# Patient Record
Sex: Female | Born: 1950 | Race: Black or African American | Hispanic: No | Marital: Married
Health system: Southern US, Community
[De-identification: ages and names within clinical notes are randomized; demographics above are authoritative.]

---

## 2016-01-06 ENCOUNTER — Inpatient Hospital Stay (HOSPITAL_COMMUNITY): Payer: Medicare Other

## 2016-01-06 ENCOUNTER — Inpatient Hospital Stay (HOSPITAL_COMMUNITY)
Admission: AD | Admit: 2016-01-06 | Discharge: 2016-01-10 | DRG: 208 | Disposition: A | Discharge status: Home / Self Care | Payer: Medicare Other | Source: Other Acute Inpatient Hospital | Attending: Family Medicine | Admitting: Family Medicine

## 2016-01-06 DIAGNOSIS — Z681 Body mass index (BMI) 19 or less, adult: Secondary | ICD-10-CM

## 2016-01-06 DIAGNOSIS — A047 Enterocolitis due to Clostridium difficile: Secondary | ICD-10-CM | POA: Diagnosis not present

## 2016-01-06 DIAGNOSIS — J9602 Acute respiratory failure with hypercapnia: Secondary | ICD-10-CM | POA: Diagnosis present

## 2016-01-06 DIAGNOSIS — D638 Anemia in other chronic diseases classified elsewhere: Secondary | ICD-10-CM | POA: Diagnosis present

## 2016-01-06 DIAGNOSIS — A0472 Enterocolitis due to Clostridium difficile, not specified as recurrent: Secondary | ICD-10-CM | POA: Clinically undetermined

## 2016-01-06 DIAGNOSIS — N2581 Secondary hyperparathyroidism of renal origin: Secondary | ICD-10-CM | POA: Diagnosis present

## 2016-01-06 DIAGNOSIS — E43 Unspecified severe protein-calorie malnutrition: Secondary | ICD-10-CM | POA: Diagnosis present

## 2016-01-06 DIAGNOSIS — N186 End stage renal disease: Secondary | ICD-10-CM | POA: Diagnosis present

## 2016-01-06 DIAGNOSIS — J9601 Acute respiratory failure with hypoxia: Secondary | ICD-10-CM | POA: Diagnosis present

## 2016-01-06 DIAGNOSIS — R0600 Dyspnea, unspecified: Secondary | ICD-10-CM | POA: Diagnosis present

## 2016-01-06 DIAGNOSIS — Z992 Dependence on renal dialysis: Secondary | ICD-10-CM | POA: Diagnosis not present

## 2016-01-06 DIAGNOSIS — I161 Hypertensive emergency: Secondary | ICD-10-CM | POA: Diagnosis present

## 2016-01-06 DIAGNOSIS — J810 Acute pulmonary edema: Secondary | ICD-10-CM | POA: Diagnosis present

## 2016-01-06 DIAGNOSIS — D649 Anemia, unspecified: Secondary | ICD-10-CM | POA: Diagnosis present

## 2016-01-06 DIAGNOSIS — I120 Hypertensive chronic kidney disease with stage 5 chronic kidney disease or end stage renal disease: Secondary | ICD-10-CM | POA: Diagnosis present

## 2016-01-06 DIAGNOSIS — E8770 Fluid overload, unspecified: Secondary | ICD-10-CM | POA: Diagnosis present

## 2016-01-06 LAB — CBC WITH DIFFERENTIAL/PLATELET
BASOS PCT: 0 %
Basophils Absolute: 0 10*3/uL (ref 0.0–0.1)
EOS ABS: 0.1 10*3/uL (ref 0.0–0.7)
Eosinophils Relative: 1 %
HEMATOCRIT: 30 % — AB (ref 36.0–46.0)
HEMOGLOBIN: 9.6 g/dL — AB (ref 12.0–15.0)
LYMPHS PCT: 9 %
Lymphs Abs: 0.6 10*3/uL — ABNORMAL LOW (ref 0.7–4.0)
MCH: 32.1 pg (ref 26.0–34.0)
MCHC: 32 g/dL (ref 30.0–36.0)
MCV: 100.3 fL — ABNORMAL HIGH (ref 78.0–100.0)
MONOS PCT: 4 %
Monocytes Absolute: 0.2 10*3/uL (ref 0.1–1.0)
NEUTROS ABS: 5.3 10*3/uL (ref 1.7–7.7)
Neutrophils Relative %: 86 %
Platelets: 175 10*3/uL (ref 150–400)
RBC: 2.99 MIL/uL — ABNORMAL LOW (ref 3.87–5.11)
RDW: 15.7 % — ABNORMAL HIGH (ref 11.5–15.5)
WBC: 6.2 10*3/uL (ref 4.0–10.5)

## 2016-01-06 LAB — LACTIC ACID, PLASMA: Lactic Acid, Venous: 0.9 mmol/L (ref 0.5–1.9)

## 2016-01-06 LAB — BLOOD GAS, ARTERIAL
Acid-base deficit: 0.8 mmol/L (ref 0.0–2.0)
BICARBONATE: 23.9 meq/L (ref 20.0–24.0)
Drawn by: 252031
FIO2: 100
MECHVT: 450 mL
O2 Saturation: 99.2 %
PATIENT TEMPERATURE: 98.6
PO2 ART: 233 mmHg — AB (ref 80.0–100.0)
RATE: 25 resp/min
TCO2: 25.2 mmol/L (ref 0–100)
pCO2 arterial: 42.9 mmHg (ref 35.0–45.0)
pH, Arterial: 7.364 (ref 7.350–7.450)

## 2016-01-06 LAB — GLUCOSE, CAPILLARY
GLUCOSE-CAPILLARY: 74 mg/dL (ref 65–99)
GLUCOSE-CAPILLARY: 82 mg/dL (ref 65–99)
Glucose-Capillary: 87 mg/dL (ref 65–99)
Glucose-Capillary: 92 mg/dL (ref 65–99)
Glucose-Capillary: 95 mg/dL (ref 65–99)

## 2016-01-06 LAB — COMPREHENSIVE METABOLIC PANEL
ALK PHOS: 78 U/L (ref 38–126)
ALT: 109 U/L — AB (ref 14–54)
AST: 95 U/L — AB (ref 15–41)
Albumin: 2.8 g/dL — ABNORMAL LOW (ref 3.5–5.0)
Anion gap: 13 (ref 5–15)
BUN: 68 mg/dL — AB (ref 6–20)
CHLORIDE: 105 mmol/L (ref 101–111)
CO2: 24 mmol/L (ref 22–32)
CREATININE: 9.99 mg/dL — AB (ref 0.44–1.00)
Calcium: 9.5 mg/dL (ref 8.9–10.3)
GFR calc Af Amer: 4 mL/min — ABNORMAL LOW (ref >60)
GFR, EST NON AFRICAN AMERICAN: 4 mL/min — AB (ref >60)
Glucose, Bld: 89 mg/dL (ref 65–99)
Potassium: 4.3 mmol/L (ref 3.5–5.1)
Sodium: 142 mmol/L (ref 135–145)
Total Bilirubin: 0.7 mg/dL (ref 0.3–1.2)
Total Protein: 5.5 g/dL — ABNORMAL LOW (ref 6.5–8.1)

## 2016-01-06 LAB — C DIFFICILE QUICK SCREEN W PCR REFLEX
C DIFFICILE (CDIFF) TOXIN: POSITIVE — AB
C Diff antigen: POSITIVE — AB
C Diff interpretation: DETECTED

## 2016-01-06 LAB — MRSA PCR SCREENING: MRSA by PCR: NEGATIVE

## 2016-01-06 LAB — TRIGLYCERIDES: Triglycerides: 109 mg/dL (ref <150)

## 2016-01-06 LAB — TROPONIN I
TROPONIN I: 0.29 ng/mL — AB (ref <0.03)
Troponin I: 0.2 ng/mL (ref <0.03)

## 2016-01-06 MED ORDER — VANCOMYCIN HCL 500 MG IV SOLR
500.0000 mg | INTRAVENOUS | Status: DC | PRN
  Filled 2016-01-06: qty 500

## 2016-01-06 MED ORDER — PIPERACILLIN-TAZOBACTAM 3.375 G IVPB
3.3750 g | Freq: Two times a day (BID) | INTRAVENOUS | Status: DC
  Administered 2016-01-06 (×2): 3.375 g via INTRAVENOUS
  Filled 2016-01-06 (×5): qty 50

## 2016-01-06 MED ORDER — FENTANYL CITRATE (PF) 100 MCG/2ML IJ SOLN
50.0000 ug | INTRAMUSCULAR | Status: DC | PRN
  Filled 2016-01-06: qty 2

## 2016-01-06 MED ORDER — PANTOPRAZOLE SODIUM 40 MG PO PACK
40.0000 mg | PACK | Freq: Every day | ORAL | Status: DC
Start: 2016-01-06 — End: 2016-01-07
  Administered 2016-01-06: 40 mg
  Filled 2016-01-06 (×2): qty 20

## 2016-01-06 MED ORDER — FENTANYL CITRATE (PF) 100 MCG/2ML IJ SOLN
50.0000 ug | INTRAMUSCULAR | Status: DC | PRN
  Administered 2016-01-06: 50 ug via INTRAVENOUS
  Filled 2016-01-06: qty 2

## 2016-01-06 MED ORDER — HYDRALAZINE HCL 20 MG/ML IJ SOLN: 5.0000 mg | INTRAMUSCULAR | Status: DC | PRN

## 2016-01-06 MED ORDER — VANCOMYCIN 50 MG/ML ORAL SOLUTION
125.0000 mg | Freq: Four times a day (QID) | ORAL | Status: DC
  Administered 2016-01-06 (×2): 125 mg via ORAL
  Filled 2016-01-06 (×6): qty 2.5

## 2016-01-06 MED ORDER — PROPOFOL 1000 MG/100ML IV EMUL
0.0000 ug/kg/min | INTRAVENOUS | Status: DC
  Administered 2016-01-06 – 2016-01-07 (×6): 50 ug/kg/min via INTRAVENOUS
  Filled 2016-01-06 (×6): qty 100

## 2016-01-06 MED ORDER — SODIUM CHLORIDE 0.9 % IV SOLN: 250.0000 mL | INTRAVENOUS | Status: DC | PRN

## 2016-01-06 MED ORDER — VANCOMYCIN HCL 500 MG IV SOLR
500.0000 mg | INTRAVENOUS | Status: DC
  Administered 2016-01-06: 500 mg via INTRAVENOUS
  Filled 2016-01-06: qty 500

## 2016-01-06 MED ORDER — HEPARIN SODIUM (PORCINE) 5000 UNIT/ML IJ SOLN
5000.0000 [IU] | Freq: Three times a day (TID) | INTRAMUSCULAR | Status: DC
  Administered 2016-01-06 – 2016-01-10 (×12): 5000 [IU] via SUBCUTANEOUS
  Filled 2016-01-06 (×14): qty 1

## 2016-01-06 MED ORDER — ANTISEPTIC ORAL RINSE SOLUTION (CORINZ)
7.0000 mL | Freq: Four times a day (QID) | OROMUCOSAL | Status: DC
  Administered 2016-01-06 – 2016-01-09 (×12): 7 mL via OROMUCOSAL

## 2016-01-06 MED ORDER — PIPERACILLIN-TAZOBACTAM 3.375 G IVPB
3.3750 g | Freq: Two times a day (BID) | INTRAVENOUS | Status: DC
  Filled 2016-01-06 (×2): qty 50

## 2016-01-06 MED ORDER — ONDANSETRON HCL 4 MG/2ML IJ SOLN
4.0000 mg | Freq: Four times a day (QID) | INTRAMUSCULAR | Status: DC | PRN
  Administered 2016-01-09: 4 mg via INTRAVENOUS
  Filled 2016-01-06: qty 2

## 2016-01-06 MED ORDER — CHLORHEXIDINE GLUCONATE 0.12% ORAL RINSE (MEDLINE KIT)
15.0000 mL | Freq: Two times a day (BID) | OROMUCOSAL | Status: DC
  Administered 2016-01-06 – 2016-01-09 (×6): 15 mL via OROMUCOSAL

## 2016-01-06 MED ORDER — PROPOFOL 1000 MG/100ML IV EMUL
0.0000 ug/kg/min | INTRAVENOUS | Status: DC
  Administered 2016-01-06: 50 ug/kg/min via INTRAVENOUS

## 2016-01-06 MED ORDER — ACETAMINOPHEN 325 MG PO TABS
650.0000 mg | ORAL_TABLET | ORAL | Status: DC | PRN
  Administered 2016-01-08 (×2): 650 mg via ORAL
  Filled 2016-01-06 (×2): qty 2

## 2016-01-06 MED ORDER — METRONIDAZOLE IN NACL 5-0.79 MG/ML-% IV SOLN
500.0000 mg | Freq: Three times a day (TID) | INTRAVENOUS | Status: DC
  Administered 2016-01-06 – 2016-01-07 (×3): 500 mg via INTRAVENOUS
  Filled 2016-01-06 (×5): qty 100

## 2016-01-06 MED ORDER — INSULIN ASPART 100 UNIT/ML ~~LOC~~ SOLN: 0.0000 [IU] | SUBCUTANEOUS | Status: DC

## 2016-01-06 NOTE — Procedures (Signed)
Pt seen on HD.  Ap 150 Vp 210, BFR 400.  SBP 95

## 2016-01-06 NOTE — Consult Note (Signed)
Cristina Johnson is an 65 y.o. female referred by Dr Cristina Johnson   Chief Complaint: ESRD HPI: [name] with ESRD on HD in [place] MWF and presented to [facility] with acute SOB.  Found to be in pulm edema with extremely high BP 257/127 per H&P.  She was intubated and transferred to [facility].  Labs from [facility]:  Hg 10.5, WBC 17.4, Scr 9.89, K 4.4, CO2 23.  CXR shows mild Pulm edema  No past medical history on file.  No past surgical history on file.  No family history on file. Social History:  has no tobacco, alcohol, and drug history on file.  Allergies: Allergies not on file  No prescriptions prior to admission.     Lab Results: UA: ND No results for input(s): WBC, HGB, HCT, PLT in the last 72 hours. BMET No results for input(s): NA, K, CL, CO2, GLUCOSE, BUN, CREATININE, CALCIUM, PHOS in the last 72 hours.  Invalid input(s): MAG LFT No results for input(s): PROT, ALBUMIN, AST, ALT, ALKPHOS, BILITOT, BILIDIR, IBILI in the last 72 hours. Dg Chest Port 1 View  Result Date: 01/06/2016 CLINICAL DATA:  Endotracheal tube and central line placement. EXAM: PORTABLE CHEST 1 VIEW COMPARISON:  None. FINDINGS: The endotracheal tube is 1.5 cm from the carina. Enteric tube in place, tip and side port below the diaphragm. Catheter tubing projects in the left supraclavicular soft tissues tip is directed toward the axilla. (Discussion with patient's nurse patient has an external jugular line) No other central line is seen. Mild cardiomegaly. There is atherosclerosis of the thoracic aorta. Fine interstitial opacities concerning for pulmonary edema. More confluent retrocardiac opacity may be atelectasis or pleural fluid. Minimal fluid in the right minor fissure. No evidence of pneumothorax. No acute osseous abnormality is seen. IMPRESSION: 1. Endotracheal tube 1.5 cm from the carina. This is borderline low and retraction of 1-2 cm recommended. 2. Catheter projecting in the left supraclavicular soft  tissues with tip directed toward the axilla, recommend removal and repositioning. 3. Interstitial opacities suspicious for pulmonary edema. Cardiomegaly with atherosclerosis of the thoracic aorta. These results were called by telephone at the time of interpretation on 01/06/2016 at 7:07 am to the nurse caring for the patient who verbally acknowledged these results. Electronically Signed   By: Rubye OaksMelanie  Ehinger M.D.   On: 01/06/2016 07:09    ROS: Pt intubated  PHYSICAL EXAM: Blood pressure (!) 143/64, pulse 82, temperature 98.5 F (36.9 C), temperature source Oral, resp. rate (!) 25, height 5\' 5"  (1.651 m), weight 54.5 kg (120 lb 2.4 oz), SpO2 98 %. HEENT: PERRLA, intubated NECK:Mild JVD LUNGS:Basilar crackles CARDIAC: RRR wo MRG ABD:+ BS Soft, nondistended, No HSM EXT: No edema. LUA AVF + bruit NEURO:Sedated  Assessment: 1. SOB, Pulm edema 2. HTN, improved with sedation 3. ESRD PLAN: 1. Plan HD today 2. Will get info from her HD unit   Cristina Johnson 01/06/2016, 10:43 AM

## 2016-01-06 NOTE — Progress Notes (Signed)
Pharmacy Antibiotic Note  Cristina BenderKatherine Johnson is a 65 y.o. female admitted on 01/06/2016 with sepsis.  Pharmacy has been consulted for Vanc/Zosyn  Dosing.   Patient has ESRD on dialysis on MWF. It is unknown if she missed any sessions. [facility] was a [facility] [place] and confirmed with [facility] that no blood cultures were drawn there but she received Vanc 1 gm at 0430 and Zosyn 3.375 gm at 0319.   WBC- 17.43 at [place], Afebrile   Plan: Zosyn 3.375 IV Q 12 H (4 hour infusion) Vanc 500 mg IV with dialysis  [facility] Nurse was instructed not to give Vanc unless patient goes to HD.  F/U nephrology recomendations  F/U cultures, clinical s/sx of improvement  Height: 5\' 5"  (165.1 cm) Weight: 120 lb 2.4 oz (54.5 kg) IBW/kg (Calculated) : 57  Temp (24hrs), Avg:98.5 F (36.9 C), Min:98.5 F (36.9 C), Max:98.5 F (36.9 C)  No results for input(s): WBC, CREATININE, LATICACIDVEN, VANCOTROUGH, VANCOPEAK, VANCORANDOM, GENTTROUGH, GENTPEAK, GENTRANDOM, TOBRATROUGH, TOBRAPEAK, TOBRARND, AMIKACINPEAK, AMIKACINTROU, AMIKACIN in the last 168 hours.  CrCl cannot be calculated (No order found.).    Allergies not on file  Antimicrobials this admission: Vanc  8/21 >>  Zosyn  8/21 >>     Microbiology results: 8/21 BCx: Pending  8/21 RCx: Pending    Thank you for allowing pharmacy to be a part of this patient's care.  Delila SpenceEmily A Stewart, Pharm D Pharmacy Resident  Pager [phone]  01/06/2016 8:56 AM

## 2016-01-06 NOTE — Progress Notes (Signed)
01/06/2016 11:47 AM Hemodialysis Note; This [place] [phone].  [name].

## 2016-01-06 NOTE — Progress Notes (Signed)
Critical troponin 0.29 reported to Dr Isaiah SergeMannam, also made aware of ST changes and inability to collect urine culture due to anuria.

## 2016-01-06 NOTE — H&P (Addendum)
PULMONARY / CRITICAL CARE MEDICINE   Name: Cristina Johnson MRN: [id number] DOB: 13-Dec-1950    ADMISSION DATE:  01/06/2016 CONSULTATION DATE:  01/06/16  REFERRING MD:  [facility]  CHIEF COMPLAINT:  Acute respiratory failure  HISTORY OF PRESENT ILLNESS:   Cristina Johnson is a 65 year old with past medical history of end-stage renal disease on hemodialysis (M-W-F via Lt arm AVF). She [facility] with acute onset dyspnea. BP on presentation was 257/127.  History prior to presentation is unclear and we didn't know if she missed any HD sessions. She had a chest x-ray that showed acute pulmonary edema. She was given Vanco, Zosyn, ,intubated for respiratory failure and transferred to [facility] for further management.  PAST MEDICAL HISTORY :  She  has no past medical history on file.  PAST SURGICAL HISTORY: She  has no past surgical history on file.  Allergies not on file  No current facility-administered medications on file prior to encounter.    No current outpatient prescriptions on file prior to encounter.    FAMILY HISTORY:  Her has no family status information on file.    SOCIAL HISTORY: She    REVIEW OF SYSTEMS:   Unable to obtain as patient is sedated on vent  SUBJECTIVE:   VITAL SIGNS: Temp 98.5 F (36.9 C) (Oral)   Ht 5\' 5"  (1.651 m)   HEMODYNAMICS:    VENTILATOR SETTINGS: Vent Mode: PRVC FiO2 (%):  [100 %] 100 % Set Rate:  [25 bmp] 25 bmp Vt Set:  [450 mL] 450 mL PEEP:  [5 cmH20] 5 cmH20 Plateau Pressure:  [15 cmH20] 15 cmH20  INTAKE / OUTPUT: No intake/output data recorded.  PHYSICAL EXAMINATION: General:  Comfortable, no distress Neuro:  No focal deficits HEENT: Moist mucus membranes, No thyromegaly, JVD Cardiovascular:  RRR, no MRG Lungs:  Clear, No wheeze or crackles Abdomen:  Soft, +_ BS Musculoskeletal:  Normal tone and bulk Skin:  Intact  LABS:  BMET No results for input(s): NA, K, CL, CO2, BUN, CREATININE, GLUCOSE in the  last 168 hours.  Electrolytes No results for input(s): CALCIUM, MG, PHOS in the last 168 hours.  CBC No results for input(s): WBC, HGB, HCT, PLT in the last 168 hours.  Coag's No results for input(s): APTT, INR in the last 168 hours.  Sepsis Markers No results for input(s): LATICACIDVEN, PROCALCITON, O2SATVEN in the last 168 hours.  ABG No results for input(s): PHART, PCO2ART, PO2ART in the last 168 hours.  Liver Enzymes No results for input(s): AST, ALT, ALKPHOS, BILITOT, ALBUMIN in the last 168 hours.  Cardiac Enzymes No results for input(s): TROPONINI, PROBNP in the last 168 hours.  Glucose  Recent Labs Lab 01/06/16 0617  GLUCAP 74    Imaging No results found.   STUDIES:  Labs from [place] significant for 01/06/16 BUN/Cr- 48/9.9 CXR - Pulmonary edema, Images reviewed CT head- Normal per report. Unable to review images. EKG- Sinus tachy, no acute ST-T changes. BNP - 4438, troponin 0.017 7.11/54/223/97%, repeat 7.25/44/87/92% WBC 17.4 Lactic acid 6, repeat 4 Mild elevation in LFTs  CULTURES: Bcx X 2 >  ANTIBIOTICS: Vanco 8/21 > Zosyn 8/21 >  SIGNIFICANT EVENTS:  LINES/TUBES: ETT 8/21  DISCUSSION: 65 year old with end-stage renal dialysis and HD 3 times a week. Presents with acute respiratory failure, pulmonary edema, hypertensive emergency. Intubated for respiratory protection.  She likely need to be dialyzed today. Assess for extubation postdialysis and cover with antibiotics empirically for infection.  ASSESSMENT / PLAN:  PULMONARY A:  Acute hypoxic, hypercarbic resp failure P:   Check ABG, CXR Continue vent support Wake up and weaning assessments after dialysis.  CARDIOVASCULAR A:  Hypertensive emergency P:  Hydralazine PRN Follow troponins, BNP, lactic acid Echocardiogram  RENAL A:   ESRD on HD M-W-F Lt arm AVF P:   Consult nephrology  GASTROINTESTINAL A:   Stable LFTs elevation P:   Keep NPO Repeat LFTs Stress ulcer  prophylaxis  HEMATOLOGIC A:   Leukocytosis from sepsis vs stress P:  Monitor  INFECTIOUS A:   Concern for infection given elevated LA and WBC P:   Cover with zosyn, vanco Check procalcitonin, cultures.  ENDOCRINE A:   Stable P:   SSI coverage  NEUROLOGIC A:   Sedation while on vent P:   RASS goal: 0 Propofol gtt   FAMILY  - Updates: No family at bedside - Inter-disciplinary family meet or Palliative Care meeting due by:  01/13/16  Critical care time- 40 mins.  Chilton GreathousePraveen Gracieann Stannard MD [place] [facility] Pager [phone] If no answer or after 3pm call: [phone] 01/06/2016, 6:47 AM

## 2016-01-07 ENCOUNTER — Inpatient Hospital Stay (HOSPITAL_COMMUNITY): Payer: Medicare Other

## 2016-01-07 DIAGNOSIS — R0600 Dyspnea, unspecified: Secondary | ICD-10-CM

## 2016-01-07 LAB — GLUCOSE, CAPILLARY
GLUCOSE-CAPILLARY: 99 mg/dL (ref 65–99)
GLUCOSE-CAPILLARY: 119 mg/dL — AB (ref 65–99)
GLUCOSE-CAPILLARY: 104 mg/dL — AB (ref 65–99)
GLUCOSE-CAPILLARY: 113 mg/dL — AB (ref 65–99)
Glucose-Capillary: 116 mg/dL — ABNORMAL HIGH (ref 65–99)

## 2016-01-07 LAB — ECHOCARDIOGRAM COMPLETE
HEIGHTINCHES: 65 in
WEIGHTICAEL: 1806.01 [oz_av]

## 2016-01-07 LAB — BASIC METABOLIC PANEL
ANION GAP: 19 — AB (ref 5–15)
BUN: 30 mg/dL — AB (ref 6–20)
CO2: 23 mmol/L (ref 22–32)
Calcium: 9.7 mg/dL (ref 8.9–10.3)
Chloride: 97 mmol/L — ABNORMAL LOW (ref 101–111)
Creatinine, Ser: 5.46 mg/dL — ABNORMAL HIGH (ref 0.44–1.00)
GFR calc Af Amer: 9 mL/min — ABNORMAL LOW (ref >60)
GFR, EST NON AFRICAN AMERICAN: 7 mL/min — AB (ref >60)
GLUCOSE: 90 mg/dL (ref 65–99)
POTASSIUM: 3.7 mmol/L (ref 3.5–5.1)
Sodium: 139 mmol/L (ref 135–145)

## 2016-01-07 LAB — CBC
HEMATOCRIT: 33.7 % — AB (ref 36.0–46.0)
Hemoglobin: 10.9 g/dL — ABNORMAL LOW (ref 12.0–15.0)
MCH: 32.2 pg (ref 26.0–34.0)
MCHC: 32.3 g/dL (ref 30.0–36.0)
MCV: 99.7 fL (ref 78.0–100.0)
PLATELETS: 163 10*3/uL (ref 150–400)
RBC: 3.38 MIL/uL — AB (ref 3.87–5.11)
RDW: 16.2 % — ABNORMAL HIGH (ref 11.5–15.5)
WBC: 8.3 10*3/uL (ref 4.0–10.5)

## 2016-01-07 MED ORDER — VANCOMYCIN 50 MG/ML ORAL SOLUTION
500.0000 mg | Freq: Four times a day (QID) | ORAL | Status: DC
  Administered 2016-01-07 – 2016-01-10 (×12): 500 mg via ORAL
  Filled 2016-01-07 (×17): qty 10

## 2016-01-07 NOTE — H&P (Signed)
PULMONARY / CRITICAL CARE MEDICINE   Name: Cristina Johnson MRN: [id number] DOB: 11-11-1950    ADMISSION DATE:  01/06/2016 CONSULTATION DATE:  01/06/16  REFERRING MD:  [facility]  CHIEF COMPLAINT:  Acute respiratory failure  HISTORY OF PRESENT ILLNESS:   [name] is a 65 year old with past medical history of end-stage renal disease on hemodialysis (M-W-F via Lt arm AVF). She presented [facility] with acute onset dyspnea. BP on presentation was 257/127.  History prior to presentation is unclear and we didn't know if she missed any HD sessions. She had a chest x-ray that showed acute pulmonary edema. She was given Vanco, Zosyn, ,intubated for respiratory failure and transferred [facility] for further management.  PAST MEDICAL HISTORY :  She  has no past medical history on file.  PAST SURGICAL HISTORY: She  has no past surgical history on file.  Allergies not on file  No current facility-administered medications on file prior to encounter.    No current outpatient prescriptions on file prior to encounter.    FAMILY HISTORY:  Her has no family status information on file.    SOCIAL HISTORY: She    REVIEW OF SYSTEMS:   Unable to obtain as patient is sedated on vent  SUBJECTIVE:   VITAL SIGNS: BP (!) 87/45   Pulse (!) 104   Temp 99 F (37.2 C)   Resp (!) 30   Ht 5\' 5"  (1.651 m)   Wt 112 lb 14 oz (51.2 kg)   SpO2 98%   BMI 18.78 kg/m   HEMODYNAMICS:    VENTILATOR SETTINGS: Vent Mode: CPAP;PSV FiO2 (%):  [40 %] 40 % Set Rate:  [25 bmp] 25 bmp Vt Set:  [450 mL] 450 mL PEEP:  [5 cmH20] 5 cmH20 Pressure Support:  [5 cmH20] 5 cmH20 Plateau Pressure:  [15 cmH20-16 cmH20] 15 cmH20  INTAKE / OUTPUT: I/O last 3 completed shifts: In: 789.7 [I.V.:377.2; IV Piggyback:412.5] Out: 3812 [Emesis/NG output:1000; Other:2812]  PHYSICAL EXAMINATION: General:  Comfortable, no distress Neuro:  No focal deficits HEENT: Moist mucus membranes, No thyromegaly,  JVD Cardiovascular:  RRR, no MRG Lungs:  Clear, No wheeze or crackles Abdomen:  Soft, +_ BS Musculoskeletal:  Normal tone and bulk Skin:  Intact  LABS:  BMET  Recent Labs Lab 01/06/16 1011 01/07/16 0307  NA 142 139  K 4.3 3.7  CL 105 97*  CO2 24 23  BUN 68* 30*  CREATININE 9.99* 5.46*  GLUCOSE 89 90    Electrolytes  Recent Labs Lab 01/06/16 1011 01/07/16 0307  CALCIUM 9.5 9.7    CBC  Recent Labs Lab 01/06/16 1011 01/07/16 0307  WBC 6.2 8.3  HGB 9.6* 10.9*  HCT 30.0* 33.7*  PLT 175 163    Coag's No results for input(s): APTT, INR in the last 168 hours.  Sepsis Markers  Recent Labs Lab 01/06/16 1011  LATICACIDVEN 0.9    ABG  Recent Labs Lab 01/06/16 0615  PHART 7.364  PCO2ART 42.9  PO2ART 233*    Liver Enzymes  Recent Labs Lab 01/06/16 1011  AST 95*  ALT 109*  ALKPHOS 78  BILITOT 0.7  ALBUMIN 2.8*    Cardiac Enzymes  Recent Labs Lab 01/06/16 1011 01/06/16 2113  TROPONINI 0.29* 0.20*    Glucose  Recent Labs Lab 01/06/16 0617 01/06/16 1152 01/06/16 1545 01/06/16 2019 01/06/16 2341 01/07/16 0820  GLUCAP 74 95 82 87 92 116*    Imaging Dg Chest Port 1 View  Result Date: 01/07/2016 CLINICAL  DATA:  65 y/o F; endotracheal tube and central line placement. EXAM: PORTABLE CHEST 1 VIEW COMPARISON:  Chest radiograph dated 01/06/2016. FINDINGS: Endotracheal tube is 3.5 cm from the carina. The enteric tube tip extends below the field of view into the abdomen. Interval improvement of aeration of the lungs bilaterally. No pneumothorax or focal consolidation. No pleural effusion. Stable cardiac silhouette given differences in positioning and technique. IMPRESSION: Interval improvement in pulmonary edema. Electronically Signed   By: Mitzi HansenLance  Furusawa-Stratton M.D.   On: 01/07/2016 06:54     STUDIES:  Labs from [place] significant for 01/06/16 BUN/Cr- 48/9.9 CXR - Pulmonary edema, Images reviewed CT head- Normal per report. Unable  to review images. EKG- Sinus tachy, no acute ST-T changes. BNP - 4438, troponin 0.017 7.11/54/223/97%, repeat 7.25/44/87/92% WBC 17.4 Lactic acid 6, repeat 4 Mild elevation in LFTs  CULTURES: Bcx X 2 >  ANTIBIOTICS: Vanco 8/21 > 8/22 Zosyn 8/21 > 8/22 PO vanco 8/21 > Flagyl 8/21 > 8/22  SIGNIFICANT EVENTS:  LINES/TUBES: ETT 8/21 > 8/22.  DISCUSSION: 65 year old with end-stage renal dialysis and HD 3 times a week. Presents with acute respiratory failure, pulmonary edema, hypertensive emergency. Intubated for respiratory protection.  Dialyzed yesterday and did well on weaning trial today > extubated Cdiff +. Started on appropriate therapy.   ASSESSMENT / PLAN:  PULMONARY A: Acute hypoxic, hypercarbic resp failure P:   Extubated Wean down O2  CARDIOVASCULAR A:  Hypertensive emergency P:  Hydralazine PRN Echocardiogram  RENAL A:   ESRD on HD M-W-F Lt arm AVF P:   On HD as per renal.  GASTROINTESTINAL A:   Stable LFTs elevation P:   Start diet Repeat LFTs  HEMATOLOGIC A:   Leukocytosis from sepsis vs stress P:  Improving  INFECTIOUS A:   Concern for infection given elevated LA and WBC + C diff P:   Continue PO vanco. Stop all other abx Follow procalcitonin, cultures.  ENDOCRINE A:   Stable P:   SSI coverage  NEUROLOGIC A:   Sedation while on vent P:   D/C Propofol gtt  FAMILY  - Updates: No family at bedside - Inter-disciplinary family meet or Palliative Care meeting due by:  01/13/16  Critical care time- 35 mins.  Chilton GreathousePraveen Tashanda Fuhrer MD [place] [facility] Pager [phone] If no answer or after 3pm call: [phone] 01/07/2016, 11:05 AM

## 2016-01-07 NOTE — Care Management Note (Signed)
Case Management Note  Patient Details  Name: Cristina Johnson MRN: [id number] Date of Birth: 1950-10-22  Subjective/Objective:     Pt admitted with acute onset of dyspnea during the interim of HD treatments               Action/Plan: PTA from home independent - pt drives [place] and [place].  Brothers and sisters are at bedside- will support pt in whatever needs she may have.  CM will continue to follow   Expected Discharge Date:                  Expected Discharge Plan:  Home/Self Care  In-House Referral:     Discharge planning Services  CM Consult  Post Acute Care Choice:    Choice offered to:     DME Arranged:    DME Agency:     HH Arranged:    HH Agency:     Status of Service:  In process, will continue to follow  If discussed at Long Length of Stay Meetings, dates discussed:    Additional Comments:  Cristina Johnson, Cristina Johnson S, RN 01/07/2016, 12:59 PM

## 2016-01-07 NOTE — Progress Notes (Signed)
S: intubated O:BP (!) 81/42   Pulse (!) 103   Temp 99.2 F (37.3 C) (Oral)   Resp (!) 27   Ht 5' 5"  (1.651 m)   Wt 51.2 kg (112 lb 14 oz)   SpO2 99%   BMI 18.78 kg/m   Intake/Output Summary (Last 24 hours) at 01/07/16 0802 Last data filed at 01/07/16 0700  Gross per 24 hour  Intake            773.3 ml  Output             3312 ml  Net          -2538.7 ml   Weight change: -3.3 kg (-7 lb 4.4 oz) FUX:NATFTDD CVS:RRR Resp: clear Abd:+ BS NDNT Ext:No edema  LUA AVF + bruit NEURO:sedated  . antiseptic oral rinse  7 mL Mouth Rinse QID  . chlorhexidine gluconate (SAGE KIT)  15 mL Mouth Rinse BID  . heparin  5,000 Units Subcutaneous Q8H  . insulin aspart  0-9 Units Subcutaneous Q4H  . metronidazole  500 mg Intravenous Q8H  . pantoprazole sodium  40 mg Per Tube Daily  . piperacillin-tazobactam (ZOSYN)  IV  3.375 g Intravenous Q12H  . vancomycin  125 mg Oral QID  . vancomycin  500 mg Intravenous Q M,W,F-HD   Dg Chest Port 1 View  Result Date: 01/07/2016 CLINICAL DATA:  65 y/o F; endotracheal tube and central line placement. EXAM: PORTABLE CHEST 1 VIEW COMPARISON:  Chest radiograph dated 01/06/2016. FINDINGS: Endotracheal tube is 3.5 cm from the carina. The enteric tube tip extends below the field of view into the abdomen. Interval improvement of aeration of the lungs bilaterally. No pneumothorax or focal consolidation. No pleural effusion. Stable cardiac silhouette given differences in positioning and technique. IMPRESSION: Interval improvement in pulmonary edema. Electronically Signed   By: Kristine Garbe M.D.   On: 01/07/2016 06:54   Dg Chest Port 1 View  Result Date: 01/06/2016 CLINICAL DATA:  Endotracheal tube and central line placement. EXAM: PORTABLE CHEST 1 VIEW COMPARISON:  None. FINDINGS: The endotracheal tube is 1.5 cm from the carina. Enteric tube in place, tip and side port below the diaphragm. Catheter tubing projects in the left supraclavicular soft tissues tip  is directed toward the axilla. (Discussion with patient's nurse patient has an external jugular line) No other central line is seen. Mild cardiomegaly. There is atherosclerosis of the thoracic aorta. Fine interstitial opacities concerning for pulmonary edema. More confluent retrocardiac opacity may be atelectasis or pleural fluid. Minimal fluid in the right minor fissure. No evidence of pneumothorax. No acute osseous abnormality is seen. IMPRESSION: 1. Endotracheal tube 1.5 cm from the carina. This is borderline low and retraction of 1-2 cm recommended. 2. Catheter projecting in the left supraclavicular soft tissues with tip directed toward the axilla, recommend removal and repositioning. 3. Interstitial opacities suspicious for pulmonary edema. Cardiomegaly with atherosclerosis of the thoracic aorta. These results were called by telephone at the time of interpretation on 01/06/2016 at 7:07 am to the nurse caring for the patient who verbally acknowledged these results. Electronically Signed   By: Jeb Levering M.D.   On: 01/06/2016 07:09   BMET    Component Value Date/Time   NA 139 01/07/2016 0307   K 3.7 01/07/2016 0307   CL 97 (L) 01/07/2016 0307   CO2 23 01/07/2016 0307   GLUCOSE 90 01/07/2016 0307   BUN 30 (H) 01/07/2016 0307   CREATININE 5.46 (H) 01/07/2016 0307   CALCIUM  9.7 01/07/2016 0307   GFRNONAA 7 (L) 01/07/2016 0307   GFRAA 9 (L) 01/07/2016 0307   CBC    Component Value Date/Time   WBC 8.3 01/07/2016 0307   RBC 3.38 (L) 01/07/2016 0307   HGB 10.9 (L) 01/07/2016 0307   HCT 33.7 (L) 01/07/2016 0307   PLT 163 01/07/2016 0307   MCV 99.7 01/07/2016 0307   MCH 32.2 01/07/2016 0307   MCHC 32.3 01/07/2016 0307   RDW 16.2 (H) 01/07/2016 0307   LYMPHSABS 0.6 (L) 01/06/2016 1011   MONOABS 0.2 01/06/2016 1011   EOSABS 0.1 01/06/2016 1011   BASOSABS 0.0 01/06/2016 1011     Assessment: 1. SOB, Pulm edema, improved 2. HTN 3. ESRD  ([place] MWF, 3.5hr, BFR 500, 2K  2Ca, DW 52kg, no Na mod, profile 2) 4. Sec HPTH  PTH 440 12/25/15 on sensipar 85m q d 5. Anemia on micera 1074m q 2 weeks Plan: 1. Plan HD tomorrow 2. ? extubation   Somtochukwu Woollard T

## 2016-01-07 NOTE — Progress Notes (Signed)
Initial Nutrition Assessment  DOCUMENTATION CODES:   Severe malnutrition in context of chronic illness  INTERVENTION:  Encouraged adequate intake of HBV protein at meals.  Recommend snacks TID between meals to better meet needs.   Will monitor intake to assess need for oral nutrition supplement.  Recommend renal multivitamin (rena-Vit) daily to replace losses from HD and in setting of malnutrition.   NUTRITION DIAGNOSIS:   Malnutrition (Severe) related to chronic illness (ESRD) as evidenced by severe depletion of muscle mass, severe depletion of body fat.  GOAL:   Patient will meet greater than or equal to 90% of their needs  MONITOR:   PO intake, Diet advancement, Weight trends, I & O's  REASON FOR ASSESSMENT:   Ventilator    ASSESSMENT:   65 y.o. F with past medical history of end-stage renal disease on hemodialysis (M-W-F via Lt arm AVF). [facility] with acute onset dyspnea. BP on presentation was elevated. Chest x-ray showed acute pulmonary edema, intubated for respiratory failure and transferred [facility]. Patient extubated morning of 8/22 and placed on 4 L [place].   Dry weight 52 kg  Patient awake at time of assessment, but was very confused. She was not sure of her UBW, if she had lost weight, or if she has a good appetite or had been finishing meals at home. After discussing with RN it appears patient has been very confused since extubation. Since assessment diet has been advanced from NPO to Renal with 1.2 L fluid restriction. Patient's weight appears to be right around recorded dry weight, but do not have prior weights to trend.   Medications reviewed and include: Novolog sliding scale Q4hrs.  Labs reviewed: CBG 82-119 past 24 hrs.  Nutrition-Focused physical exam completed. Findings are severe fat depletion, moderate to severe muscle depletion, and mild edema. Need more data to determine context of malnutrition - likely chronic due to level of  depletions.  Diet Order:  Diet renal with fluid restriction Fluid restriction: 1200 mL Fluid; Room service appropriate? Yes; Fluid consistency: Thin  Skin:  Reviewed, no issues  Last BM:  01/06/2016  Height:   Ht Readings from Last 1 Encounters:  01/06/16 5\' 5"  (1.651 m)    Weight:   Wt Readings from Last 1 Encounters:  01/07/16 112 lb 14 oz (51.2 kg)    Ideal Body Weight:  56.8 kg  BMI:  Body mass index is 18.78 kg/m.  Estimated Nutritional Needs:   Kcal:  1620-1890 (30-35 kcal/kg Adj body weight)  Protein:  >65 grams (>1.2 g/kg Adj body weight)  Fluid:  1.2 L fluid restriction  EDUCATION NEEDS:   Education needs no appropriate at this time  Helane RimaLeanne Inice Sanluis, MS, RD, LDN

## 2016-01-07 NOTE — Progress Notes (Signed)
Pt extubated per MD order. Placed on 4L [place]. Positive cuff leak. Pt able to vocalize after extubation. No distress noted. Placed on 4 L [place]. VS stable. Will cont to monitor.

## 2016-01-07 NOTE — Progress Notes (Signed)
  Echocardiogram 2D Echocardiogram has been performed.  Arvil ChacoFoster, [place] Selke 01/07/2016, 1:51 PM

## 2016-01-08 LAB — COMPREHENSIVE METABOLIC PANEL
ALK PHOS: 68 U/L (ref 38–126)
ALT: 145 U/L — AB (ref 14–54)
AST: 110 U/L — ABNORMAL HIGH (ref 15–41)
Albumin: 2.7 g/dL — ABNORMAL LOW (ref 3.5–5.0)
Anion gap: 18 — ABNORMAL HIGH (ref 5–15)
BILIRUBIN TOTAL: 0.9 mg/dL (ref 0.3–1.2)
BUN: 55 mg/dL — ABNORMAL HIGH (ref 6–20)
CALCIUM: 9.2 mg/dL (ref 8.9–10.3)
CO2: 27 mmol/L (ref 22–32)
CREATININE: 7.95 mg/dL — AB (ref 0.44–1.00)
Chloride: 94 mmol/L — ABNORMAL LOW (ref 101–111)
GFR calc non Af Amer: 5 mL/min — ABNORMAL LOW (ref >60)
GFR, EST AFRICAN AMERICAN: 5 mL/min — AB (ref >60)
Glucose, Bld: 91 mg/dL (ref 65–99)
Potassium: 3.4 mmol/L — ABNORMAL LOW (ref 3.5–5.1)
SODIUM: 139 mmol/L (ref 135–145)
TOTAL PROTEIN: 5.8 g/dL — AB (ref 6.5–8.1)

## 2016-01-08 LAB — RENAL FUNCTION PANEL
Albumin: 2.9 g/dL — ABNORMAL LOW (ref 3.5–5.0)
Anion gap: 14 (ref 5–15)
BUN: 23 mg/dL — ABNORMAL HIGH (ref 6–20)
CO2: 27 mmol/L (ref 22–32)
Calcium: 9.7 mg/dL (ref 8.9–10.3)
Chloride: 95 mmol/L — ABNORMAL LOW (ref 101–111)
Creatinine, Ser: 4.85 mg/dL — ABNORMAL HIGH (ref 0.44–1.00)
GFR calc Af Amer: 10 mL/min — ABNORMAL LOW (ref >60)
GFR calc non Af Amer: 9 mL/min — ABNORMAL LOW (ref >60)
Glucose, Bld: 87 mg/dL (ref 65–99)
Phosphorus: 5.3 mg/dL — ABNORMAL HIGH (ref 2.5–4.6)
Potassium: 3.6 mmol/L (ref 3.5–5.1)
SODIUM: 136 mmol/L (ref 135–145)

## 2016-01-08 LAB — CBC
HCT: 32.1 % — ABNORMAL LOW (ref 36.0–46.0)
HEMATOCRIT: 27.2 % — AB (ref 36.0–46.0)
HEMOGLOBIN: 9.2 g/dL — AB (ref 12.0–15.0)
Hemoglobin: 10.6 g/dL — ABNORMAL LOW (ref 12.0–15.0)
MCH: 32.9 pg (ref 26.0–34.0)
MCH: 32.5 pg (ref 26.0–34.0)
MCHC: 33.8 g/dL (ref 30.0–36.0)
MCHC: 33 g/dL (ref 30.0–36.0)
MCV: 97.1 fL (ref 78.0–100.0)
MCV: 98.5 fL (ref 78.0–100.0)
PLATELETS: 140 10*3/uL — AB (ref 150–400)
Platelets: 147 10*3/uL — ABNORMAL LOW (ref 150–400)
RBC: 2.8 MIL/uL — AB (ref 3.87–5.11)
RBC: 3.26 MIL/uL — AB (ref 3.87–5.11)
RDW: 16.2 % — ABNORMAL HIGH (ref 11.5–15.5)
RDW: 16.2 % — ABNORMAL HIGH (ref 11.5–15.5)
WBC: 6.5 10*3/uL (ref 4.0–10.5)
WBC: 6.1 10*3/uL (ref 4.0–10.5)

## 2016-01-08 LAB — GLUCOSE, CAPILLARY
GLUCOSE-CAPILLARY: 112 mg/dL — AB (ref 65–99)
GLUCOSE-CAPILLARY: 78 mg/dL (ref 65–99)
GLUCOSE-CAPILLARY: 98 mg/dL (ref 65–99)
Glucose-Capillary: 101 mg/dL — ABNORMAL HIGH (ref 65–99)
Glucose-Capillary: 108 mg/dL — ABNORMAL HIGH (ref 65–99)
Glucose-Capillary: 100 mg/dL — ABNORMAL HIGH (ref 65–99)

## 2016-01-08 LAB — CULTURE, RESPIRATORY
CULTURE: NORMAL
SPECIAL REQUESTS: NORMAL

## 2016-01-08 LAB — CULTURE, RESPIRATORY W GRAM STAIN: Gram Stain: NONE SEEN

## 2016-01-08 LAB — PHOSPHORUS: Phosphorus: 10.3 mg/dL — ABNORMAL HIGH (ref 2.5–4.6)

## 2016-01-08 MED ORDER — DARBEPOETIN ALFA 100 MCG/0.5ML IJ SOSY
PREFILLED_SYRINGE | INTRAMUSCULAR | Status: AC
  Filled 2016-01-08: qty 0.5

## 2016-01-08 MED ORDER — DARBEPOETIN ALFA 100 MCG/0.5ML IJ SOSY
100.0000 ug | PREFILLED_SYRINGE | INTRAMUSCULAR | Status: DC
  Administered 2016-01-08: 100 ug via INTRAVENOUS
  Filled 2016-01-08: qty 0.5

## 2016-01-08 MED ORDER — CINACALCET HCL 30 MG PO TABS
60.0000 mg | ORAL_TABLET | Freq: Every day | ORAL | Status: DC
  Administered 2016-01-08: 30 mg via ORAL
  Administered 2016-01-09: 60 mg via ORAL
  Filled 2016-01-08 (×3): qty 2

## 2016-01-08 MED ORDER — HEPARIN SODIUM (PORCINE) 1000 UNIT/ML DIALYSIS
1000.0000 [IU] | INTRAMUSCULAR | Status: DC | PRN
  Filled 2016-01-08: qty 1

## 2016-01-08 MED ORDER — PENTAFLUOROPROP-TETRAFLUOROETH EX AERO
1.0000 | INHALATION_SPRAY | CUTANEOUS | Status: DC | PRN
Start: 2016-01-08 — End: 2016-01-08

## 2016-01-08 MED ORDER — ALUMINUM HYDROXIDE GEL 320 MG/5ML PO SUSP
30.0000 mL | Freq: Four times a day (QID) | ORAL | Status: DC | PRN
  Administered 2016-01-08: 30 mL via ORAL
  Filled 2016-01-08 (×2): qty 30

## 2016-01-08 MED ORDER — PENTAFLUOROPROP-TETRAFLUOROETH EX AERO
1.0000 | INHALATION_SPRAY | CUTANEOUS | Status: DC | PRN
Start: 2016-01-08 — End: 2016-01-10

## 2016-01-08 MED ORDER — SODIUM CHLORIDE 0.9 % IV SOLN
100.0000 mL | INTRAVENOUS | Status: DC | PRN
Start: 2016-01-08 — End: 2016-01-08

## 2016-01-08 MED ORDER — HEPARIN SODIUM (PORCINE) 1000 UNIT/ML DIALYSIS
100.0000 [IU]/kg | INTRAMUSCULAR | Status: DC | PRN
Start: 2016-01-08 — End: 2016-01-10
  Filled 2016-01-08: qty 6

## 2016-01-08 MED ORDER — SODIUM CHLORIDE 0.9 % IV SOLN: 100.0000 mL | INTRAVENOUS | Status: DC | PRN

## 2016-01-08 MED ORDER — SEVELAMER CARBONATE 800 MG PO TABS
800.0000 mg | ORAL_TABLET | Freq: Three times a day (TID) | ORAL | Status: DC
  Administered 2016-01-08 – 2016-01-09 (×6): 800 mg via ORAL
  Filled 2016-01-08 (×7): qty 1

## 2016-01-08 MED ORDER — ALTEPLASE 2 MG IJ SOLR
2.0000 mg | Freq: Once | INTRAMUSCULAR | Status: DC | PRN
  Filled 2016-01-08: qty 2

## 2016-01-08 MED ORDER — LIDOCAINE HCL (PF) 1 % IJ SOLN
5.0000 mL | INTRAMUSCULAR | Status: DC | PRN
  Filled 2016-01-08: qty 5

## 2016-01-08 MED ORDER — LIDOCAINE-PRILOCAINE 2.5-2.5 % EX CREA
1.0000 | TOPICAL_CREAM | CUTANEOUS | Status: DC | PRN
Start: 2016-01-08 — End: 2016-01-10
  Filled 2016-01-08: qty 5

## 2016-01-08 MED ORDER — LIDOCAINE HCL (PF) 1 % IJ SOLN
5.0000 mL | INTRAMUSCULAR | Status: DC | PRN
Start: 2016-01-08 — End: 2016-01-10
  Filled 2016-01-08: qty 5

## 2016-01-08 MED ORDER — ALTEPLASE 2 MG IJ SOLR: 2.0000 mg | Freq: Once | INTRAMUSCULAR | Status: DC | PRN

## 2016-01-08 MED ORDER — HEPARIN SODIUM (PORCINE) 1000 UNIT/ML DIALYSIS
20.0000 [IU]/kg | INTRAMUSCULAR | Status: DC | PRN
  Filled 2016-01-08: qty 2

## 2016-01-08 MED ORDER — RENA-VITE PO TABS
1.0000 | ORAL_TABLET | Freq: Every day | ORAL | Status: DC
  Administered 2016-01-08 – 2016-01-09 (×2): 1 via ORAL
  Filled 2016-01-08 (×3): qty 1

## 2016-01-08 MED ORDER — LIDOCAINE-PRILOCAINE 2.5-2.5 % EX CREA
1.0000 | TOPICAL_CREAM | CUTANEOUS | Status: DC | PRN
Start: 2016-01-08 — End: 2016-01-08
  Filled 2016-01-08: qty 5

## 2016-01-08 NOTE — Progress Notes (Signed)
eLink Physician-Brief Progress Note Patient Name: Cristina BenderKatherine Johnson DOB: [birth year] MRN: [id number]   Date of Service  01/08/2016  HPI/Events of Note  Patient c/o indigestion and tooth pain. Can't be sure that this isn't cardiac in origin. Patient already has Tylenol ordered.   eICU Interventions  Will order: 1. Alternagel 30 mL PO Q 6 hours PRN indigestion.  2. Given already ordered Tylenol. 3. 12 Lead EKG STAT. 4. Cycle Troponin.      Intervention Category Major Interventions: Other:  Lenell AntuSommer,Maleea Camilo Eugene 01/08/2016, 10:49 PM

## 2016-01-08 NOTE — Progress Notes (Signed)
PULMONARY / CRITICAL CARE MEDICINE   Name: Cristina Johnson MRN: [id number] DOB: 11/19/1950    ADMISSION DATE:  01/06/2016 CONSULTATION DATE:  01/06/16  REF[facility]ING MD:  [facility]  CHIEF COMPLAINT:  Acute respiratory failure  HISTORY OF PRESENT ILLNESS:   [name] is a 65 year old with past medical history of end-stage renal disease on hemodialysis (M-W-F via Lt arm AVF). [facility] with [facility] onset dyspnea. BP on presentation was 257/127.  History prior to presentation is unclear and we didn't know if she missed any HD sessions. She had a chest x-ray that showed acute pulmonary edema. She was given Vanco, Zosyn, ,intubated for respiratory failure and transferred [facility] for further management.  PAST MEDICAL HISTORY :  She  has no past medical history on file.  PAST SURGICAL HISTORY: She  has no past surgical history on file.  No Known Allergies  No current facility-administered medications on file prior to encounter.    No current outpatient prescriptions on file prior to encounter.    FAMILY HISTORY:  Her has no family status information on file.    SOCIAL HISTORY: She    REVIEW OF SYSTEMS:   Denies any fevers, chills No dyspnea, cough, sputum production. No chest pain, palpitations.  No nausea, vomiting, diarrhea, constipation.  SUBJECTIVE:   VITAL SIGNS: BP (!) 92/57   Pulse (!) 102   Temp 98.4 F (36.9 C) (Oral)   Resp 20   Ht 5\' 5"  (1.651 m)   Wt 106 lb 11.2 oz (48.4 kg)   SpO2 98%   BMI 17.76 kg/m   HEMODYNAMICS:    VENTILATOR SETTINGS:    INTAKE / OUTPUT: I/O last 3 completed shifts: In: 627.3 [P.O.:150; I.V.:214.8; IV Piggyback:262.5] Out: -   PHYSICAL EXAMINATION: General:  Comfortable, no distress Neuro:  No focal deficits HEENT: Moist mucus membranes, No thyromegaly, JVD Cardiovascular:  RRR, no MRG Lungs:  Clear, No wheeze or crackles Abdomen:  Soft, +_ BS Musculoskeletal:  Normal tone and bulk Skin:   Intact  LABS:  BMET  Recent Labs Lab 01/06/16 1011 01/07/16 0307 01/08/16 0239  NA 142 139 139  K 4.3 3.7 3.4*  CL 105 97* 94*  CO2 24 23 27   BUN 68* 30* 55*  CREATININE 9.99* 5.46* 7.95*  GLUCOSE 89 90 91    Electrolytes  Recent Labs Lab 01/06/16 1011 01/07/16 0307 01/08/16 0239  CALCIUM 9.5 9.7 9.2  PHOS  --   --  10.3*    CBC  Recent Labs Lab 01/06/16 1011 01/07/16 0307 01/08/16 0239  WBC 6.2 8.3 6.5  HGB 9.6* 10.9* 9.2*  HCT 30.0* 33.7* 27.2*  PLT 175 163 147*    Coag's No results for input(s): APTT, INR in the last 168 hours.  Sepsis Markers  Recent Labs Lab 01/06/16 1011  LATICACIDVEN 0.9    ABG  Recent Labs Lab 01/06/16 0615  PHART 7.364  PCO2ART 42.9  PO2ART 233*    Liver Enzymes  Recent Labs Lab 01/06/16 1011 01/08/16 0239  AST 95* 110*  ALT 109* 145*  ALKPHOS 78 68  BILITOT 0.7 0.9  ALBUMIN 2.8* 2.7*    Cardiac Enzymes  Recent Labs Lab 01/06/16 1011 01/06/16 2113  TROPONINI 0.29* 0.20*    Glucose  Recent Labs Lab 01/07/16 1535 01/07/16 1949 01/08/16 0025 01/08/16 0416 01/08/16 0742 01/08/16 1118  GLUCAP 104* 113* 112* 101* 78 108*    Imaging No results found.   STUDIES:  Echo 8/22  LVEF  60-65%, moderate LVH, normal wall motion, diastolic   dysfunction, indeterminate (but likely elevated) LV filling   pressure, mild MR, severe LAE, trivial TR, RVSP 20 mmHg, normal   IVC.  CULTURES: Bcx X 2 >  ANTIBIOTICS: Vanco 8/21 > 8/22 Zosyn 8/21 > 8/22 PO vanco 8/21 > Flagyl 8/21 > 8/22  SIGNIFICANT EVENTS:  LINES/TUBES: ETT 8/21 > 8/22.  DISCUSSION: 65 year old with end-stage renal dialysis and HD 3 times a week. Presents with acute respiratory failure, pulmonary edema, hypertensive emergency. Intubated for respiratory protection.  Extubated 8/22. Stable overnight. Stable for transfer from ICU.  ASSESSMENT / PLAN:  PULMONARY A: Acute hypoxic, hypercarbic resp failure P:    Extubated Wean down O2  CARDIOVASCULAR A:  Hypertensive emergency P:  Hydralazine PRN  RENAL A:   ESRD on HD M-W-F Lt arm AVF P:   On HD as per renal.  GASTROINTESTINAL A:   Stable LFTs elevation P:   Start diet Repeat LFTs  HEMATOLOGIC A:   Leukocytosis from sepsis vs stress P:  Improving  INFECTIOUS A:   Concern for infection given elevated LA and WBC + C diff P:   Continue PO vanco. Stop all other abx Follow procalcitonin, cultures.  ENDOCRINE A:   Stable P:   SSI coverage  NEUROLOGIC A:   Stable P:    FAMILY  - Updates: No family at bedside - Inter-disciplinary family meet or Palliative Care meeting due by:  01/13/16   Chilton GreathousePraveen Brisha Mccabe MD [facility] Pager [phone] If no answer or after 3pm call: [phone] 01/08/2016, 2:49 PM

## 2016-01-08 NOTE — Progress Notes (Signed)
S: Wants to eat.  No pain  No sob O:BP 121/72   Pulse 92   Temp 98.4 F (36.9 C) (Oral)   Resp 20   Ht _0  (1.651 m)   Wt 49.5 kg (109 lb 2 oz)   SpO2 100%   BMI 18.16 kg/m   Intake/Output Summary (Last 24 hours) at 01/08/16 0710 Last data filed at 01/07/16 1700  Gross per 24 hour  Intake           168.03 ml  Output                0 ml  Net           168.03 ml   Weight change: -1.7 kg (-3 lb 12 oz) Gen: Awake and alert CVS:RRR Resp: clear Abd:+ BS NDNT Ext:No edema  LUA AVF + bruit NEURO: CNI No asterixis  . antiseptic oral rinse  7 mL Mouth Rinse QID  . chlorhexidine gluconate (SAGE KIT)  15 mL Mouth Rinse BID  . heparin  5,000 Units Subcutaneous Q8H  . insulin aspart  0-9 Units Subcutaneous Q4H  . vancomycin  500 mg Oral QID   Dg Chest Port 1 View  Result Date: 01/07/2016 CLINICAL DATA:  65 y/o F; endotracheal tube and central line placement. EXAM: PORTABLE CHEST 1 VIEW COMPARISON:  Chest radiograph dated 01/06/2016. FINDINGS: Endotracheal tube is 3.5 cm from the carina. The enteric tube tip extends below the field of view into the abdomen. Interval improvement of aeration of the lungs bilaterally. No pneumothorax or focal consolidation. No pleural effusion. Stable cardiac silhouette given differences in positioning and technique. IMPRESSION: Interval improvement in pulmonary edema. Electronically Signed   By: Kristine Garbe M.D.   On: 01/07/2016 06:54   BMET    Component Value Date/Time   NA 139 01/08/2016 0239   K 3.4 (L) 01/08/2016 0239   CL 94 (L) 01/08/2016 0239   CO2 27 01/08/2016 0239   GLUCOSE 91 01/08/2016 0239   BUN 55 (H) 01/08/2016 0239   CREATININE 7.95 (H) 01/08/2016 0239   CALCIUM 9.2 01/08/2016 0239   GFRNONAA 5 (L) 01/08/2016 0239   GFRAA 5 (L) 01/08/2016 0239   CBC    Component Value Date/Time   WBC 6.5 01/08/2016 0239   RBC 2.80 (L) 01/08/2016 0239   HGB 9.2 (L) 01/08/2016 0239   HCT 27.2 (L) 01/08/2016 0239   PLT 147 (L)  01/08/2016 0239   MCV 97.1 01/08/2016 0239   MCH 32.9 01/08/2016 0239   MCHC 33.8 01/08/2016 0239   RDW 16.2 (H) 01/08/2016 0239   LYMPHSABS 0.6 (L) 01/06/2016 1011   MONOABS 0.2 01/06/2016 1011   EOSABS 0.1 01/06/2016 1011   BASOSABS 0.0 01/06/2016 1011     Assessment: 1. SOB, Pulm edema, improved 2. HTN, BP controlled 3. ESRD  (outpt [place] [place], MWF, 3.5hr, BFR 500, 2K 2Ca, DW 52kg, no Na mod, profile 2) 4. Sec HPTH  PTH 440 12/25/15 on sensipar 33m q d 5. Anemia on micera 1011m q 2 weeks Plan: 1. HD today on 4K bath 2. Resume sensipar 3. Resume MVI 4. Resume PO4 binder 5. Start aranesp for [employer]. Get up out of bed and could transfer to floor   Gertie Broerman T

## 2016-01-08 NOTE — Progress Notes (Signed)
Fentanyl 50mcg watse with Tera PartridgeLauren Robbins,RN

## 2016-01-09 DIAGNOSIS — I161 Hypertensive emergency: Secondary | ICD-10-CM | POA: Diagnosis present

## 2016-01-09 DIAGNOSIS — Z992 Dependence on renal dialysis: Secondary | ICD-10-CM

## 2016-01-09 DIAGNOSIS — J810 Acute pulmonary edema: Secondary | ICD-10-CM | POA: Diagnosis present

## 2016-01-09 DIAGNOSIS — N186 End stage renal disease: Secondary | ICD-10-CM

## 2016-01-09 DIAGNOSIS — A0472 Enterocolitis due to Clostridium difficile, not specified as recurrent: Secondary | ICD-10-CM | POA: Clinically undetermined

## 2016-01-09 LAB — GLUCOSE, CAPILLARY
GLUCOSE-CAPILLARY: 84 mg/dL (ref 65–99)
GLUCOSE-CAPILLARY: 97 mg/dL (ref 65–99)
GLUCOSE-CAPILLARY: 99 mg/dL (ref 65–99)
Glucose-Capillary: 71 mg/dL (ref 65–99)

## 2016-01-09 LAB — CBC
HCT: 32.4 % — ABNORMAL LOW (ref 36.0–46.0)
HEMOGLOBIN: 10.5 g/dL — AB (ref 12.0–15.0)
MCH: 32.6 pg (ref 26.0–34.0)
MCHC: 32.4 g/dL (ref 30.0–36.0)
MCV: 100.6 fL — AB (ref 78.0–100.0)
Platelets: 127 10*3/uL — ABNORMAL LOW (ref 150–400)
RBC: 3.22 MIL/uL — AB (ref 3.87–5.11)
RDW: 16.3 % — ABNORMAL HIGH (ref 11.5–15.5)
WBC: 6.3 10*3/uL (ref 4.0–10.5)

## 2016-01-09 LAB — BASIC METABOLIC PANEL
Anion gap: 11 (ref 5–15)
BUN: 28 mg/dL — AB (ref 6–20)
CHLORIDE: 94 mmol/L — AB (ref 101–111)
CO2: 29 mmol/L (ref 22–32)
Calcium: 8.9 mg/dL (ref 8.9–10.3)
Creatinine, Ser: 5.72 mg/dL — ABNORMAL HIGH (ref 0.44–1.00)
GFR calc Af Amer: 8 mL/min — ABNORMAL LOW (ref >60)
GFR calc non Af Amer: 7 mL/min — ABNORMAL LOW (ref >60)
GLUCOSE: 87 mg/dL (ref 65–99)
POTASSIUM: 3.4 mmol/L — AB (ref 3.5–5.1)
Sodium: 134 mmol/L — ABNORMAL LOW (ref 135–145)

## 2016-01-09 LAB — TROPONIN I
TROPONIN I: 0.53 ng/mL — AB (ref <0.03)
Troponin I: 0.58 ng/mL (ref <0.03)
Troponin I: 0.45 ng/mL (ref <0.03)

## 2016-01-09 MED ORDER — ALUM & MAG HYDROXIDE-SIMETH 200-200-20 MG/5ML PO SUSP
30.0000 mL | ORAL | Status: DC | PRN
  Administered 2016-01-10: 30 mL via ORAL
  Filled 2016-01-09: qty 30

## 2016-01-09 MED ORDER — CALCIUM CARBONATE ANTACID 500 MG PO CHEW
400.0000 mg | CHEWABLE_TABLET | Freq: Three times a day (TID) | ORAL | Status: DC
  Administered 2016-01-09: 400 mg via ORAL
  Filled 2016-01-09: qty 2

## 2016-01-09 MED ORDER — LIP MEDEX EX OINT: 1.0000 "application " | TOPICAL_OINTMENT | CUTANEOUS | Status: DC | PRN

## 2016-01-09 MED ORDER — GI COCKTAIL ~~LOC~~: 30.0000 mL | Freq: Three times a day (TID) | ORAL | Status: DC | PRN

## 2016-01-09 MED ORDER — CALCIUM CARBONATE ANTACID 500 MG PO CHEW: 1.0000 | CHEWABLE_TABLET | Freq: Every day | ORAL | Status: DC

## 2016-01-09 MED ORDER — MUSCLE RUB 10-15 % EX CREA
1.0000 | TOPICAL_CREAM | CUTANEOUS | Status: DC | PRN
Start: 2016-01-09 — End: 2016-01-10
  Filled 2016-01-09: qty 85

## 2016-01-09 MED ORDER — CALCIUM CARBONATE ANTACID 500 MG PO CHEW
1.0000 | CHEWABLE_TABLET | Freq: Once | ORAL | Status: AC
  Administered 2016-01-09: 200 mg via ORAL
  Filled 2016-01-09: qty 1

## 2016-01-09 MED ORDER — POLYVINYL ALCOHOL 1.4 % OP SOLN: 2.0000 [drp] | OPHTHALMIC | Status: DC | PRN

## 2016-01-09 MED ORDER — PHENOL 1.4 % MT LIQD: 1.0000 | OROMUCOSAL | Status: DC | PRN

## 2016-01-09 MED ORDER — CALCIUM CARBONATE ANTACID 500 MG PO CHEW
1.0000 | CHEWABLE_TABLET | Freq: Three times a day (TID) | ORAL | Status: DC | PRN
  Administered 2016-01-09: 200 mg via ORAL
  Filled 2016-01-09: qty 1

## 2016-01-09 MED ORDER — SALINE SPRAY 0.65 % NA SOLN
2.0000 | NASAL | Status: DC | PRN
  Filled 2016-01-09: qty 44

## 2016-01-09 MED ORDER — CALCIUM CARBONATE ANTACID 500 MG PO CHEW: 400.0000 mg | CHEWABLE_TABLET | Freq: Three times a day (TID) | ORAL | Status: DC | PRN

## 2016-01-09 MED ORDER — GUAIFENESIN-DM 100-10 MG/5ML PO SYRP
5.0000 mL | ORAL_SOLUTION | ORAL | Status: DC | PRN
  Filled 2016-01-09: qty 5

## 2016-01-09 NOTE — Progress Notes (Signed)
S:  Reflux sxs gone after taking "tums"  Diarrhea improving O:BP 127/64 (BP Location: Right Leg)   Pulse 94   Temp 97.3 F (36.3 C)   Resp 18   Ht 5' 5"  (1.651 m)   Wt 50.3 kg (110 lb 12.8 oz)   SpO2 100%   BMI 18.44 kg/m   Intake/Output Summary (Last 24 hours) at 01/09/16 0818 Last data filed at 01/09/16 0542  Gross per 24 hour  Intake              320 ml  Output             1001 ml  Net             -681 ml   Weight change: -1.1 kg (-2 lb 6.8 oz) Gen: Awake and alert CVS:RRR Resp: clear Abd:+ BS NDNT Ext:No edema  LUA AVF + bruit NEURO: CNI No asterixis  . antiseptic oral rinse  7 mL Mouth Rinse QID  . chlorhexidine gluconate (SAGE KIT)  15 mL Mouth Rinse BID  . cinacalcet  60 mg Oral Q supper  . darbepoetin (ARANESP) injection - DIALYSIS  100 mcg Intravenous Q Wed-HD  . heparin  5,000 Units Subcutaneous Q8H  . insulin aspart  0-9 Units Subcutaneous Q4H  . multivitamin  1 tablet Oral QHS  . sevelamer carbonate  800 mg Oral TID WC  . vancomycin  500 mg Oral QID   No results found. BMET    Component Value Date/Time   NA 134 (L) 01/09/2016 0452   K 3.4 (L) 01/09/2016 0452   CL 94 (L) 01/09/2016 0452   CO2 29 01/09/2016 0452   GLUCOSE 87 01/09/2016 0452   BUN 28 (H) 01/09/2016 0452   CREATININE 5.72 (H) 01/09/2016 0452   CALCIUM 8.9 01/09/2016 0452   GFRNONAA 7 (L) 01/09/2016 0452   GFRAA 8 (L) 01/09/2016 0452   CBC    Component Value Date/Time   WBC 6.3 01/09/2016 0452   RBC 3.22 (L) 01/09/2016 0452   HGB 10.5 (L) 01/09/2016 0452   HCT 32.4 (L) 01/09/2016 0452   PLT 127 (L) 01/09/2016 0452   MCV 100.6 (H) 01/09/2016 0452   MCH 32.6 01/09/2016 0452   MCHC 32.4 01/09/2016 0452   RDW 16.3 (H) 01/09/2016 0452   LYMPHSABS 0.6 (L) 01/06/2016 1011   MONOABS 0.2 01/06/2016 1011   EOSABS 0.1 01/06/2016 1011   BASOSABS 0.0 01/06/2016 1011     Assessment: 1. SOB, Pulm edema, improved 2. HTN, BP controlled 3. ESRD  (outpt [place], MWF, 3.5hr, BFR 500,  2K 2Ca, DW 52kg, no Na mod, profile 2) 4. Sec HPTH  PTH 440 12/25/15 on sensipar 69m q d 5. Anemia on micera 1043m q 2 weeks 6. + C diff on vanco PO Plan: 1. HD tomorrow.  Do not replace K   Rogene Meth T

## 2016-01-09 NOTE — Progress Notes (Addendum)
PROGRESS NOTE    Cristina Johnson  OAC:[id number] DOB: 29-Jan-1951 DOA: 01/06/2016 PCP: No PCP  Brief Narrative:  Cristina Johnson is a 65 year old with past medical history of end-stage renal disease on hemodialysis (M-W-F via Lt arm AVF). She presented at [facility] with acute onset dyspnea. BP on presentation was 257/127, and it remains unclear if she missed any HD. CXR showed pulmonary edema which has improved with HD. She was initially intubated due to acute decompensation, but successfully extubated the following day. She's been treated with HCAP abx but these have been discontinued with no evidence of infection. She developed watery diarrhea which was positive for active C diff infection for which oral vancomycin was started. It has since improved.   Assessment & Plan:   Active Problems:   Acute hypoxemic respiratory failure (HCC)  C difficile colitis: Improving on po vancomycin.  - Continue vancomycin - Monitor VS's and follow blood cultures (NGTD)  Acute hypoxemic, hypercarbic respiratory failure: Due to pulmonary edema from volume overload. Improving s/p ETT, now without hypoxemia or respiratory distress.  - Continue to monitor pulse ox, oxygen as needed  Hypertensive emergency: Likely due to volume overload, ?due to missed HD. Has resolved s/p HD, now not treated with scheduled medications - hydralazine prn  ESRD: Receives HD MWF. Access: LUE AVF - Management per nephrology: HD 8/25.   DVT prophylaxis: Heparin Code Status: Full Family Communication: No family at bedside, pt declined offer to contact them Disposition Plan: Anticipate D/C home 8/25 following HD  Consultants:   CCM primary 8/21 - 8/23  Nephrology, Dr. Mercy Moore for HD  Procedures:  - ETT 8/21 > 8/22 - Echo 8/22: LVEF 60-65%, moderate LVH, normal wall motion, diastolic dysfunction, indeterminate (but likely elevated) LV filling pressure, mild MR, severe LAE, trivial TR, RVSP 20 mmHg, normal IVC. - HD  8/23  Antimicrobials:  Vanco 8/21 > 8/22 Zosyn 8/21 > 8/22 PO vanco 8/21 > Flagyl 8/21 > 8/22  Subjective: Pt feels improved breathing, mild sore throat. No cough or wheezing. Still having diarrhea but improving. Able to get up to [facility]. Had dental pain and indigestion last night improved with tums.   Objective: Vitals:   01/08/16 1514 01/08/16 2304 01/09/16 0441 01/09/16 0500  BP:  (!) 157/50 127/64   Pulse:  (!) 110 94   Resp:  19 18   Temp: 98.9 F (37.2 C) 98.6 F (37 C) 97.3 F (36.3 C)   TempSrc: Oral     SpO2:  90% 100%   Weight:    50.3 kg (110 lb 12.8 oz)  Height:        Intake/Output Summary (Last 24 hours) at 01/09/16 1307 Last data filed at 01/09/16 0542  Gross per 24 hour  Intake              220 ml  Output                0 ml  Net              220 ml   Filed Weights   01/08/16 0700 01/08/16 1051 01/09/16 0500  Weight: 49.4 kg (108 lb 14.5 oz) 48.4 kg (106 lb 11.2 oz) 50.3 kg (110 lb 12.8 oz)    Examination:  General exam: Appears calm and comfortable  Respiratory system: Clear to auscultation. Respiratory effort normal. Cardiovascular system: S1 & S2 heard, RRR. No JVD, murmurs, rubs, gallops or clicks. No pedal edema. + bruit in LUE AVF.  Gastrointestinal system: Abdomen  is nondistended, soft and nontender. No organomegaly or masses felt. Normal bowel sounds heard. Central nervous system: Alert and oriented. No focal neurological deficits. Extremities: Symmetric 5 x 5 power. Skin: No rashes, lesions or ulcers Psychiatry: Judgement and insight appear normal. Mood & affect appropriate.   Data Reviewed: I have personally reviewed following labs and imaging studies  CBC:  Recent Labs Lab 01/06/16 1011 01/07/16 0307 01/08/16 0239 01/08/16 1654 01/09/16 0452  WBC 6.2 8.3 6.5 6.1 6.3  NEUTROABS 5.3  --   --   --   --   HGB 9.6* 10.9* 9.2* 10.6* 10.5*  HCT 30.0* 33.7* 27.2* 32.1* 32.4*  MCV 100.3* 99.7 97.1 98.5 100.6*  PLT 175 163 147* 140* 127*    Basic Metabolic Panel:  Recent Labs Lab 01/06/16 1011 01/07/16 0307 01/08/16 0239 01/08/16 1654 01/09/16 0452  NA 142 139 139 136 134*  K 4.3 3.7 3.4* 3.6 3.4*  CL 105 97* 94* 95* 94*  CO2 24 23 27 27 29   GLUCOSE 89 90 91 87 87  BUN 68* 30* 55* 23* 28*  CREATININE 9.99* 5.46* 7.95* 4.85* 5.72*  CALCIUM 9.5 9.7 9.2 9.7 8.9  PHOS  --   --  10.3* 5.3*  --    GFR: Estimated Creatinine Clearance: 7.8 mL/min (by C-G formula based on SCr of 5.72 mg/dL). Liver Function Tests:  Recent Labs Lab 01/06/16 1011 01/08/16 0239 01/08/16 1654  AST 95* 110*  --   ALT 109* 145*  --   ALKPHOS 78 68  --   BILITOT 0.7 0.9  --   PROT 5.5* 5.8*  --   ALBUMIN 2.8* 2.7* 2.9*   No results for input(s): LIPASE, AMYLASE in the last 168 hours. No results for input(s): AMMONIA in the last 168 hours. Coagulation Profile: No results for input(s): INR, PROTIME in the last 168 hours. Cardiac Enzymes:  Recent Labs Lab 01/06/16 1011 01/06/16 2113 01/08/16 2319 01/09/16 0452 01/09/16 1059  TROPONINI 0.29* 0.20* 0.58* 0.53* 0.45*   BNP (last 3 results) No results for input(s): PROBNP in the last 8760 hours. HbA1C: No results for input(s): HGBA1C in the last 72 hours. CBG:  Recent Labs Lab 01/08/16 2034 01/09/16 0002 01/09/16 0433 01/09/16 0800 01/09/16 1212  GLUCAP 100* 99 71 84 97   Lipid Profile: No results for input(s): CHOL, HDL, LDLCALC, TRIG, CHOLHDL, LDLDIRECT in the last 72 hours. Thyroid Function Tests: No results for input(s): TSH, T4TOTAL, FREET4, T3FREE, THYROIDAB in the last 72 hours. Anemia Panel: No results for input(s): VITAMINB12, FOLATE, FERRITIN, TIBC, IRON, RETICCTPCT in the last 72 hours. Urine analysis: No results found for: COLORURINE, APPEARANCEUR, LABSPEC, PHURINE, GLUCOSEU, HGBUR, BILIRUBINUR, KETONESUR, PROTEINUR, UROBILINOGEN, NITRITE, LEUKOCYTESUR Sepsis Labs: @LABRCNTIP (procalcitonin:4,lacticidven:4)  ) Recent Results (from the past 240 hour(s))   MRSA PCR Screening     Status: None   Collection Time: 01/06/16  6:26 AM  Result Value Ref Range Status   MRSA by PCR NEGATIVE NEGATIVE Final    Comment:        The GeneXpert MRSA Assay (FDA approved for NASAL specimens only), is one component of a comprehensive MRSA colonization surveillance program. It is not intended to diagnose MRSA infection nor to guide or monitor treatment for MRSA infections.   Culture, respiratory (NON-Expectorated)     Status: None   Collection Time: 01/06/16  8:24 AM  Result Value Ref Range Status   Specimen Description TRACHEAL ASPIRATE  Final   Special Requests Normal  Final   Gram Stain  NO WBC SEEN NO ORGANISMS SEEN   Final   Culture Consistent with normal respiratory flora.  Final   Report Status 01/08/2016 FINAL  Final  Culture, blood (Routine X 2) w Reflex to ID Panel     Status: None (Preliminary result)   Collection Time: 01/06/16 10:20 AM  Result Value Ref Range Status   Specimen Description BLOOD RIGHT HAND  Final   Special Requests IN PEDIATRIC BOTTLE 3.0 CC  Final   Culture NO GROWTH 2 DAYS  Final   Report Status PENDING  Incomplete  Culture, blood (Routine X 2) w Reflex to ID Panel     Status: None (Preliminary result)   Collection Time: 01/06/16 10:30 AM  Result Value Ref Range Status   Specimen Description BLOOD RIGHT HAND  Final   Special Requests IN PEDIATRIC BOTTLE 3.00 CC  Final   Culture NO GROWTH 2 DAYS  Final   Report Status PENDING  Incomplete  C difficile quick scan w PCR reflex     Status: Abnormal   Collection Time: 01/06/16 10:52 AM  Result Value Ref Range Status   C Diff antigen POSITIVE (A) NEGATIVE Final   C Diff toxin POSITIVE (A) NEGATIVE Final   C Diff interpretation Toxin producing C. difficile detected.  Final    Comment: CRITICAL RESULT CALLED TO, READ BACK BY AND VERIFIED WITH: A.JARRELL RN AT 5284 ON 01/06/16 BY A.DAVIS      Radiology Studies: No results found.  Scheduled Meds: . antiseptic oral  rinse  7 mL Mouth Rinse QID  . calcium carbonate  400 mg of elemental calcium Oral TID WC  . chlorhexidine gluconate (SAGE KIT)  15 mL Mouth Rinse BID  . cinacalcet  60 mg Oral Q supper  . darbepoetin (ARANESP) injection - DIALYSIS  100 mcg Intravenous Q Wed-HD  . heparin  5,000 Units Subcutaneous Q8H  . multivitamin  1 tablet Oral QHS  . sevelamer carbonate  800 mg Oral TID WC  . vancomycin  500 mg Oral QID   Continuous Infusions:    LOS: 3 days   Time spent: 25 min  Vance Gather, MD [facility] Pager [phone]   If 7PM-7AM, please contact night-coverage [email or web address] Password [email or web address] 01/09/2016, 1:07 PM

## 2016-01-10 MED ORDER — LIDOCAINE HCL (PF) 1 % IJ SOLN: 5.0000 mL | INTRAMUSCULAR | Status: DC | PRN

## 2016-01-10 MED ORDER — VANCOMYCIN 50 MG/ML ORAL SOLUTION: 125.0000 mg | Freq: Four times a day (QID) | ORAL | 0 refills | Status: DC

## 2016-01-10 MED ORDER — SODIUM CHLORIDE 0.9 % IV SOLN: 100.0000 mL | INTRAVENOUS | Status: DC | PRN

## 2016-01-10 MED ORDER — LIDOCAINE-PRILOCAINE 2.5-2.5 % EX CREA: 1.0000 "application " | TOPICAL_CREAM | CUTANEOUS | Status: DC | PRN

## 2016-01-10 MED ORDER — HEPARIN SODIUM (PORCINE) 1000 UNIT/ML DIALYSIS
100.0000 [IU]/kg | INTRAMUSCULAR | Status: DC | PRN
  Filled 2016-01-10: qty 5

## 2016-01-10 MED ORDER — VANCOMYCIN 50 MG/ML ORAL SOLUTION: 125.0000 mg | Freq: Four times a day (QID) | ORAL | 0 refills | Status: AC

## 2016-01-10 MED ORDER — HEPARIN SODIUM (PORCINE) 1000 UNIT/ML DIALYSIS
1000.0000 [IU] | INTRAMUSCULAR | Status: DC | PRN
  Filled 2016-01-10: qty 1

## 2016-01-10 MED ORDER — PENTAFLUOROPROP-TETRAFLUOROETH EX AERO: 1.0000 "application " | INHALATION_SPRAY | CUTANEOUS | Status: DC | PRN

## 2016-01-10 MED ORDER — BLISTEX MEDICATED EX OINT
TOPICAL_OINTMENT | CUTANEOUS | Status: DC | PRN
  Filled 2016-01-10: qty 6.3

## 2016-01-10 MED ORDER — ALTEPLASE 2 MG IJ SOLR: 2.0000 mg | Freq: Once | INTRAMUSCULAR | Status: DC | PRN

## 2016-01-10 NOTE — Discharge Summary (Addendum)
Physician Discharge Summary  Lorayne BenderKatherine Erb ZOX:[id number] DOB: [birth year] DOA: 01/06/2016  PCP: Pcp Not In System  Admit date: 01/06/2016 Discharge date: 01/14/2016  Admitted From: Home Disposition:  Home  Recommendations for Outpatient Follow-up:  1. Follow up with PCP in 1-2 weeks 2. Patient was treated for acute respiratory failure due to volume overload. She is to continue hemodialysis and fluid restriction.  3. Developed C. diff colitis treated with po vancomycin, to complete 10 further days of treatment following discharge.   Home Health: None Equipment/Devices: None  Discharge Condition: Stable CODE STATUS: Full Diet recommendation: Renal with fluid restriction  Brief/Interim Summary: [name] is a 65 year old with past medical history of end-stage renal disease on hemodialysis (M-W-F via Lt arm AVF). She presented at [facility] with acute onset dyspnea. BP on presentation was 257/127, and it remains unclear if she missed any HD. CXR showed pulmonary edema which has improved with HD. She was initially intubated due to acute decompensation, but successfully extubated the following day. She's been treated with HCAP abx but these have been discontinued with no evidence of infection. She developed watery diarrhea which was positive for active C diff infection for which oral vancomycin was started. It has since improved and she experiences no dyspnea now that she is euvolemic.  Discharge Diagnoses:  Principal Problem:   Acute hypoxemic respiratory failure (HCC) Active Problems:   Acute pulmonary edema (HCC)   Hypertensive emergency   ESRD on dialysis (HCC)   Clostridium difficile colitis   Protein-calorie malnutrition, severe (HCC)   Anemia of chronic disease    Discharge Instructions  Discharge Instructions    Discharge instructions    Complete by:  As directed   You were admitted due to severe breathing problems, called acute respiratory failure. You have improved  significantly since that time with continued hemodialysis treatments. It is very important that you continue getting dialysis as scheduled and DO NOT MISS ANY DIALYSIS. You should also limit your fluid intake to 1200ml per day. You must seek medical care right away if you notice trouble breathing again, or if you develop fever, chest pain, or worsening diarrhea.   You were treated for an infection in the colon called "C. diff" colitis. This is the cause of the diarrhea you experienced. You must continue treatment for this when you leave the hospital. The treatment is oral vancomycin liquid 4 times per day. It is very important that you take the full duration (10 MORE days after discharge). Please seek medical care if you are unable to take this medication or if you experience worsening diarrhea after this initial improvement.   It was a pleasure getting to know you. Take care! - Dr. Jarvis NewcomerGrunz   Increase activity slowly    Complete by:  As directed       Medication List    TAKE these medications   atorvastatin 20 MG tablet Commonly known as:  LIPITOR Take 20 mg by mouth daily.   calcium carbonate 500 MG chewable tablet Commonly known as:  TUMS - dosed in mg elemental calcium Chew 1 tablet by mouth daily as needed for indigestion or heartburn.   cinacalcet 30 MG tablet Commonly known as:  SENSIPAR Take 30 mg by mouth 2 (two) times daily with a meal.   hydrALAZINE 50 MG tablet Commonly known as:  APRESOLINE Take 50 mg by mouth 2 (two) times daily.   lisinopril 40 MG tablet Commonly known as:  PRINIVIL,ZESTRIL Take 40 mg by mouth daily.  metoprolol succinate 100 MG 24 hr tablet Commonly known as:  TOPROL-XL Take 100 mg by mouth 2 (two) times daily. Take with or immediately following a meal.   pantoprazole 40 MG tablet Commonly known as:  PROTONIX Take 40 mg by mouth daily.   vancomycin 50 mg/mL oral solution Commonly known as:  VANCOCIN Take 2.5 mLs (125 mg total) by mouth 4  (four) times daily.      Follow-up Information    Primary Care Physician. Schedule an appointment as soon as possible for a visit in 1 week(s).          No Known Allergies  Consultations:  Nephrology  Procedures/Studies: Dg Chest Port 1 View  Result Date: 01/07/2016 CLINICAL DATA:  65 y/o F; endotracheal tube and central line placement. EXAM: PORTABLE CHEST 1 VIEW COMPARISON:  Chest radiograph dated 01/06/2016. FINDINGS: Endotracheal tube is 3.5 cm from the carina. The enteric tube tip extends below the field of view into the abdomen. Interval improvement of aeration of the lungs bilaterally. No pneumothorax or focal consolidation. No pleural effusion. Stable cardiac silhouette given differences in positioning and technique. IMPRESSION: Interval improvement in pulmonary edema. Electronically Signed   By: Mitzi Hansen M.D.   On: 01/07/2016 06:54   Dg Chest Port 1 View  Result Date: 01/06/2016 CLINICAL DATA:  Endotracheal tube and central line placement. EXAM: PORTABLE CHEST 1 VIEW COMPARISON:  None. FINDINGS: The endotracheal tube is 1.5 cm from the carina. Enteric tube in place, tip and side port below the diaphragm. Catheter tubing projects in the left supraclavicular soft tissues tip is directed toward the axilla. (Discussion with patient's nurse patient has an external jugular line) No other central line is seen. Mild cardiomegaly. There is atherosclerosis of the thoracic aorta. Fine interstitial opacities concerning for pulmonary edema. More confluent retrocardiac opacity may be atelectasis or pleural fluid. Minimal fluid in the right minor fissure. No evidence of pneumothorax. No acute osseous abnormality is seen. IMPRESSION: 1. Endotracheal tube 1.5 cm from the carina. This is borderline low and retraction of 1-2 cm recommended. 2. Catheter projecting in the left supraclavicular soft tissues with tip directed toward the axilla, recommend removal and repositioning. 3.  Interstitial opacities suspicious for pulmonary edema. Cardiomegaly with atherosclerosis of the thoracic aorta. These results were called by telephone at the time of interpretation on 01/06/2016 at 7:07 am to the nurse caring for the patient who verbally acknowledged these results. Electronically Signed   By: Rubye Oaks M.D.   On: 01/06/2016 07:09   Subjective: Pt feels better, no dyspnea, chest pain, syncope. LBM yesterday, formed.   Discharge Exam: Vitals:   01/10/16 1036 01/10/16 1127  BP:  (!) 107/50  Pulse:  90  Resp:  20  Temp: 97.6 F (36.4 C) 97.8 F (36.6 C)   Vitals:   01/10/16 0947 01/10/16 1000 01/10/16 1036 01/10/16 1127  BP: (!) 101/53 93/60  (!) 107/50  Pulse: 86 95  90  Resp:    20  Temp:   97.6 F (36.4 C) 97.8 F (36.6 C)  TempSrc:   Oral   SpO2:   100% 96%  Weight:   48.5 kg (106 lb 14.8 oz)   Height:        General: Pt is alert, awake, not in acute distress receiving HD Cardiovascular: RRR, S1/S2 +, no rubs, no gallops Respiratory: CTA bilaterally, no wheezing, no rhonchi Abdominal: Soft, NT, ND, bowel sounds + Extremities: no edema, no cyanosis  The results of significant diagnostics  from this hospitalization (including imaging, microbiology, ancillary and laboratory) are listed below for reference.     Microbiology: Recent Results (from the past 240 hour(s))  MRSA PCR Screening     Status: None   Collection Time: 01/06/16  6:26 AM  Result Value Ref Range Status   MRSA by PCR NEGATIVE NEGATIVE Final    Comment:        The GeneXpert MRSA Assay (FDA approved for NASAL specimens only), is one component of a comprehensive MRSA colonization surveillance program. It is not intended to diagnose MRSA infection nor to guide or monitor treatment for MRSA infections.   Culture, respiratory (NON-Expectorated)     Status: None   Collection Time: 01/06/16  8:24 AM  Result Value Ref Range Status   Specimen Description TRACHEAL ASPIRATE  Final    Special Requests Normal  Final   Gram Stain NO WBC SEEN NO ORGANISMS SEEN   Final   Culture Consistent with normal respiratory flora.  Final   Report Status 01/08/2016 FINAL  Final  Culture, blood (Routine X 2) w Reflex to ID Panel     Status: None   Collection Time: 01/06/16 10:20 AM  Result Value Ref Range Status   Specimen Description BLOOD RIGHT HAND  Final   Special Requests IN PEDIATRIC BOTTLE 3.0 CC  Final   Culture NO GROWTH 5 DAYS  Final   Report Status 01/11/2016 FINAL  Final  Culture, blood (Routine X 2) w Reflex to ID Panel     Status: None   Collection Time: 01/06/16 10:30 AM  Result Value Ref Range Status   Specimen Description BLOOD RIGHT HAND  Final   Special Requests IN PEDIATRIC BOTTLE 3.00 CC  Final   Culture NO GROWTH 5 DAYS  Final   Report Status 01/11/2016 FINAL  Final  C difficile quick scan w PCR reflex     Status: Abnormal   Collection Time: 01/06/16 10:52 AM  Result Value Ref Range Status   C Diff antigen POSITIVE (A) NEGATIVE Final   C Diff toxin POSITIVE (A) NEGATIVE Final   C Diff interpretation Toxin producing C. difficile detected.  Final    Comment: CRITICAL RESULT CALLED TO, READ BACK BY AND VERIFIED WITH: A.JARRELL RN AT 1155 ON 01/06/16 BY A.DAVIS      Labs: BNP (last 3 results) No results for input(s): BNP in the last 8760 hours. Basic Metabolic Panel:  Recent Labs Lab 01/08/16 0239 01/08/16 1654 01/09/16 0452  NA 139 136 134*  K 3.4* 3.6 3.4*  CL 94* 95* 94*  CO2 27 27 29   GLUCOSE 91 87 87  BUN 55* 23* 28*  CREATININE 7.95* 4.85* 5.72*  CALCIUM 9.2 9.7 8.9  PHOS 10.3* 5.3*  --    Liver Function Tests:  Recent Labs Lab 01/08/16 0239 01/08/16 1654  AST 110*  --   ALT 145*  --   ALKPHOS 68  --   BILITOT 0.9  --   PROT 5.8*  --   ALBUMIN 2.7* 2.9*   No results for input(s): LIPASE, AMYLASE in the last 168 hours. No results for input(s): AMMONIA in the last 168 hours. CBC:  Recent Labs Lab 01/08/16 0239  01/08/16 1654 01/09/16 0452  WBC 6.5 6.1 6.3  HGB 9.2* 10.6* 10.5*  HCT 27.2* 32.1* 32.4*  MCV 97.1 98.5 100.6*  PLT 147* 140* 127*   Cardiac Enzymes:  Recent Labs Lab 01/08/16 2319 01/09/16 0452 01/09/16 1059  TROPONINI 0.58* 0.53* 0.45*   BNP:  Invalid input(s): POCBNP CBG:  Recent Labs Lab 01/08/16 2034 01/09/16 0002 01/09/16 0433 01/09/16 0800 01/09/16 1212  GLUCAP 100* 99 71 84 97   D-Dimer No results for input(s): DDIMER in the last 72 hours. Hgb A1c No results for input(s): HGBA1C in the last 72 hours. Lipid Profile No results for input(s): CHOL, HDL, LDLCALC, TRIG, CHOLHDL, LDLDIRECT in the last 72 hours. Thyroid function studies No results for input(s): TSH, T4TOTAL, T3FREE, THYROIDAB in the last 72 hours.  Invalid input(s): FREET3 Anemia work up No results for input(s): VITAMINB12, FOLATE, FERRITIN, TIBC, IRON, RETICCTPCT in the last 72 hours. Urinalysis No results found for: COLORURINE, APPEARANCEUR, LABSPEC, PHURINE, GLUCOSEU, HGBUR, BILIRUBINUR, KETONESUR, PROTEINUR, UROBILINOGEN, NITRITE, LEUKOCYTESUR Sepsis Labs Invalid input(s): PROCALCITONIN,  WBC,  LACTICIDVEN Microbiology Recent Results (from the past 240 hour(s))  MRSA PCR Screening     Status: None   Collection Time: 01/06/16  6:26 AM  Result Value Ref Range Status   MRSA by PCR NEGATIVE NEGATIVE Final    Comment:        The GeneXpert MRSA Assay (FDA approved for NASAL specimens only), is one component of a comprehensive MRSA colonization surveillance program. It is not intended to diagnose MRSA infection nor to guide or monitor treatment for MRSA infections.   Culture, respiratory (NON-Expectorated)     Status: None   Collection Time: 01/06/16  8:24 AM  Result Value Ref Range Status   Specimen Description TRACHEAL ASPIRATE  Final   Special Requests Normal  Final   Gram Stain NO WBC SEEN NO ORGANISMS SEEN   Final   Culture Consistent with normal respiratory flora.  Final    Report Status 01/08/2016 FINAL  Final  Culture, blood (Routine X 2) w Reflex to ID Panel     Status: None   Collection Time: 01/06/16 10:20 AM  Result Value Ref Range Status   Specimen Description BLOOD RIGHT HAND  Final   Special Requests IN PEDIATRIC BOTTLE 3.0 CC  Final   Culture NO GROWTH 5 DAYS  Final   Report Status 01/11/2016 FINAL  Final  Culture, blood (Routine X 2) w Reflex to ID Panel     Status: None   Collection Time: 01/06/16 10:30 AM  Result Value Ref Range Status   Specimen Description BLOOD RIGHT HAND  Final   Special Requests IN PEDIATRIC BOTTLE 3.00 CC  Final   Culture NO GROWTH 5 DAYS  Final   Report Status 01/11/2016 FINAL  Final  C difficile quick scan w PCR reflex     Status: Abnormal   Collection Time: 01/06/16 10:52 AM  Result Value Ref Range Status   C Diff antigen POSITIVE (A) NEGATIVE Final   C Diff toxin POSITIVE (A) NEGATIVE Final   C Diff interpretation Toxin producing C. difficile detected.  Final    Comment: CRITICAL RESULT CALLED TO, READ BACK BY AND VERIFIED WITH: A.JARRELL RN AT 1155 ON 01/06/16 BY A.DAVIS      Time coordinating discharge: Over 30 minutes  SIGNED: Hazeline Junker, MD  [facility] 01/14/2016, 5:38 PM Pager [phone]  If 7PM-7AM, please contact night-coverage [email or web address] Password [email or web address]

## 2016-01-10 NOTE — Care Management Note (Signed)
Case Management Note  Patient Details  Name: Lorayne BenderKatherine Westerfeld MRN: [id number] Date of Birth: Mar 21, 1951  Subjective/Objective:                    Action/Plan: Plan is to d/c to home today. No further needs identified per CM.  Expected Discharge Date:   01/10/2016           Expected Discharge Plan:  Home/Self Care  In-House Referral:     Discharge planning Services  CM Consult  Status of Service:  completed  If discussed at Long Length of Stay Meetings, dates discussed:    Additional Comments:  Epifanio LeschesCole, Kue Fox Hudson, RN 01/10/2016, 11:34 AM

## 2016-01-10 NOTE — Progress Notes (Signed)
Nsg Discharge Note  Admit Date:  01/06/2016 Discharge date: 01/10/2016   Cristina Johnson to be D/C'd Home per MD order.  AVS completed.  Copy for chart, and copy for patient signed, and dated. Patient/caregiver able to verbalize understanding.  Discharge Medication:   Medication List    TAKE these medications   atorvastatin 20 MG tablet Commonly known as:  LIPITOR Take 20 mg by mouth daily.   calcium carbonate 500 MG chewable tablet Commonly known as:  TUMS - dosed in mg elemental calcium Chew 1 tablet by mouth daily as needed for indigestion or heartburn.   cinacalcet 30 MG tablet Commonly known as:  SENSIPAR Take 30 mg by mouth 2 (two) times daily with a meal.   hydrALAZINE 50 MG tablet Commonly known as:  APRESOLINE Take 50 mg by mouth 2 (two) times daily.   lisinopril 40 MG tablet Commonly known as:  PRINIVIL,ZESTRIL Take 40 mg by mouth daily.   metoprolol succinate 100 MG 24 hr tablet Commonly known as:  TOPROL-XL Take 100 mg by mouth 2 (two) times daily. Take with or immediately following a meal.   pantoprazole 40 MG tablet Commonly known as:  PROTONIX Take 40 mg by mouth daily.   vancomycin 50 mg/mL oral solution Commonly known as:  VANCOCIN Take 2.5 mLs (125 mg total) by mouth 4 (four) times daily.       Discharge Assessment: Vitals:   01/10/16 1036 01/10/16 1127  BP:  (!) 107/50  Pulse:  90  Resp:  20  Temp: 97.6 F (36.4 C) 97.8 F (36.6 C)   Skin clean, dry and intact without evidence of skin break down, no evidence of skin tears noted. IV catheter discontinued intact. Site without signs and symptoms of complications - no redness or edema noted at insertion site, patient denies c/o pain - only slight tenderness at site.  Dressing with slight pressure applied.  D/c Instructions-Education: Discharge instructions given to patient/family with verbalized understanding. D/c education completed with patient/family including follow up instructions,  medication list, d/c activities limitations if indicated, with other d/c instructions as indicated by MD - patient able to verbalize understanding, all questions fully answered. Patient instructed to return to ED, call 911, or call MD for any changes in condition.  Patient escorted via WC, and D/C home via private auto.  Deborra Phegley [name], RN 01/10/2016 12:05 PM

## 2016-01-10 NOTE — Procedures (Signed)
Pt seen on HD.  Ap 190 Vp 260.  BFR 500.  Feels well and says she is ready to go home.  OK to go from renal standpoint

## 2016-01-11 LAB — CULTURE, BLOOD (ROUTINE X 2)
Culture: NO GROWTH
Culture: NO GROWTH

## 2016-01-14 DIAGNOSIS — E43 Unspecified severe protein-calorie malnutrition: Secondary | ICD-10-CM | POA: Diagnosis present

## 2016-01-14 DIAGNOSIS — D638 Anemia in other chronic diseases classified elsewhere: Secondary | ICD-10-CM | POA: Diagnosis present

## 2016-04-17 DEATH — deceased

## 2017-01-13 IMAGING — CR DG CHEST 1V PORT
1 series · 1 of 1 positions shown · non-contrast
Comparison: Chest radiograph dated 01/06/2016.

CLINICAL DATA: 64 y/o F; endotracheal tube and central line
placement.

EXAM:
PORTABLE CHEST 1 VIEW

[AP]
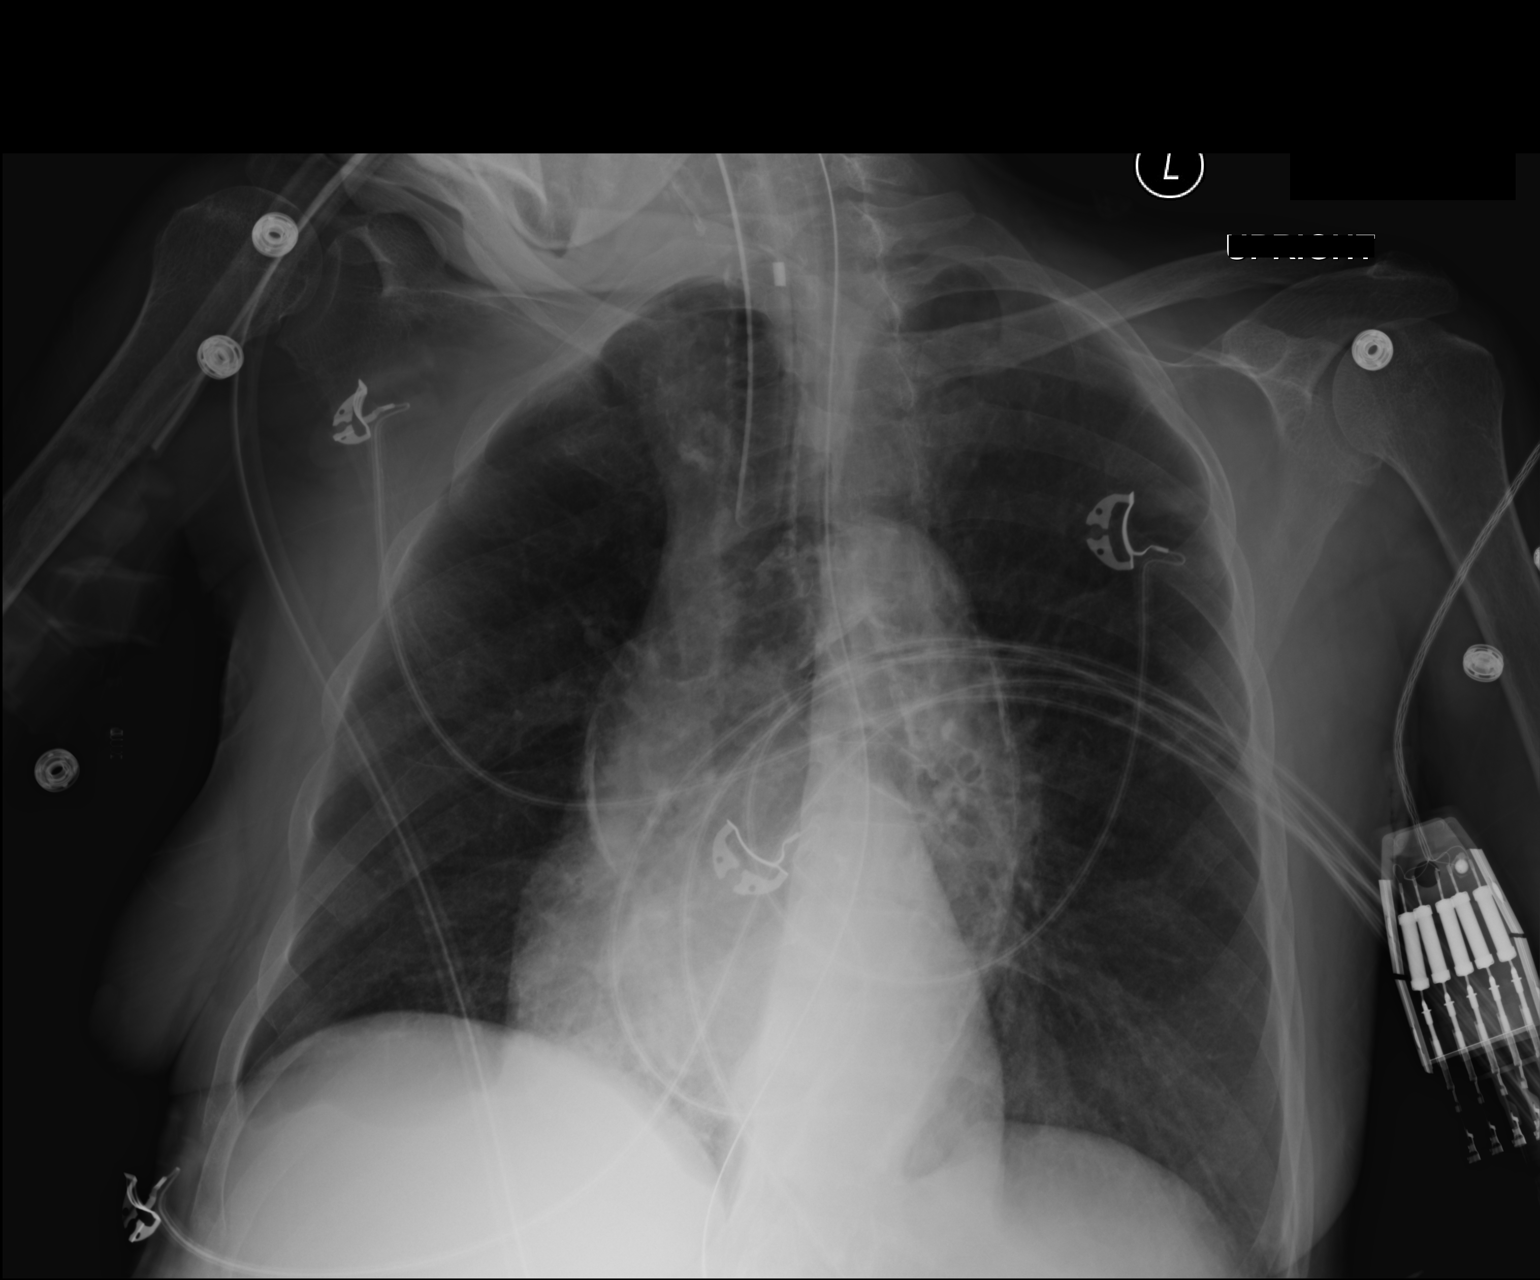

[1 of 1 positions shown; findings below may reference images not displayed]

FINDINGS: Endotracheal tube is 3.5 cm from the carina. The enteric tube tip
extends below the field of view into the abdomen. Interval
improvement of aeration of the lungs bilaterally. No pneumothorax or
focal consolidation. No pleural effusion. Stable cardiac silhouette
given differences in positioning and technique.
IMPRESSION: Interval improvement in pulmonary edema.

By: Sakshi Maddox M.D.
